# Patient Record
Sex: Male | Born: 1985 | Race: White | Hispanic: No | Marital: Single | Smoking: Never smoker
Health system: Southern US, Community
[De-identification: ages and names within clinical notes are randomized; demographics above are authoritative.]

## PROBLEM LIST (undated history)

## (undated) DIAGNOSIS — N289 Disorder of kidney and ureter, unspecified: Secondary | ICD-10-CM

## (undated) DIAGNOSIS — G40909 Epilepsy, unspecified, not intractable, without status epilepticus: Secondary | ICD-10-CM

## (undated) DIAGNOSIS — D649 Anemia, unspecified: Secondary | ICD-10-CM

## (undated) DIAGNOSIS — N2581 Secondary hyperparathyroidism of renal origin: Secondary | ICD-10-CM

## (undated) DIAGNOSIS — IMO0001 Reserved for inherently not codable concepts without codable children: Secondary | ICD-10-CM

## (undated) DIAGNOSIS — H9193 Unspecified hearing loss, bilateral: Secondary | ICD-10-CM

## (undated) DIAGNOSIS — K219 Gastro-esophageal reflux disease without esophagitis: Secondary | ICD-10-CM

## (undated) DIAGNOSIS — F79 Unspecified intellectual disabilities: Secondary | ICD-10-CM

## (undated) DIAGNOSIS — R399 Unspecified symptoms and signs involving the genitourinary system: Secondary | ICD-10-CM

## (undated) DIAGNOSIS — G809 Cerebral palsy, unspecified: Secondary | ICD-10-CM

## (undated) DIAGNOSIS — I10 Essential (primary) hypertension: Secondary | ICD-10-CM

## (undated) DIAGNOSIS — Z905 Acquired absence of kidney: Secondary | ICD-10-CM

## (undated) DIAGNOSIS — N319 Neuromuscular dysfunction of bladder, unspecified: Secondary | ICD-10-CM

## (undated) DIAGNOSIS — Z789 Other specified health status: Secondary | ICD-10-CM

## (undated) DIAGNOSIS — N186 End stage renal disease: Secondary | ICD-10-CM

## (undated) HISTORY — DX: End stage renal disease: N18.6

## (undated) HISTORY — PX: VESICOSTOMY: SHX2661

## (undated) HISTORY — DX: Gastro-esophageal reflux disease without esophagitis: K21.9

## (undated) HISTORY — DX: Unspecified intellectual disabilities: F79

## (undated) HISTORY — PX: BLADDER CLOSURE VESICOSTOMY: SHX1234

## (undated) HISTORY — PX: MEATOTOMY: SHX5133

## (undated) HISTORY — PX: TONSILLECTOMY: SUR1361

## (undated) HISTORY — DX: Acquired absence of kidney: Z90.5

## (undated) HISTORY — PX: TONSILECTOMY, ADENOIDECTOMY, BILATERAL MYRINGOTOMY AND TUBES: SHX2538

## (undated) HISTORY — DX: Secondary hyperparathyroidism of renal origin: N25.81

## (undated) HISTORY — DX: Unspecified hearing loss, bilateral: H91.93

## (undated) HISTORY — DX: Neuromuscular dysfunction of bladder, unspecified: N31.9

## (undated) HISTORY — DX: Epilepsy, unspecified, not intractable, without status epilepticus: G40.909

## (undated) HISTORY — PX: INGUINAL HERNIA REPAIR: SUR1180

## (undated) HISTORY — DX: Unspecified symptoms and signs involving the genitourinary system: R39.9

## (undated) HISTORY — DX: Anemia, unspecified: D64.9

---
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
## 2006-06-09 ENCOUNTER — Ambulatory Visit (HOSPITAL_COMMUNITY): Admission: RE | Admit: 2006-06-09 | Discharge: 2006-06-09 | Payer: Self-pay | Admitting: *Deleted

## 2009-02-22 ENCOUNTER — Encounter: Payer: Self-pay | Admitting: Internal Medicine

## 2009-03-29 ENCOUNTER — Encounter: Admission: RE | Admit: 2009-03-29 | Discharge: 2009-03-29 | Payer: Self-pay | Admitting: Orthopedic Surgery

## 2009-08-25 ENCOUNTER — Encounter (INDEPENDENT_AMBULATORY_CARE_PROVIDER_SITE_OTHER): Payer: Self-pay | Admitting: *Deleted

## 2009-09-15 ENCOUNTER — Emergency Department (HOSPITAL_COMMUNITY): Admission: EM | Admit: 2009-09-15 | Discharge: 2009-09-15 | Payer: Self-pay | Admitting: Emergency Medicine

## 2009-09-27 ENCOUNTER — Ambulatory Visit: Payer: Self-pay | Admitting: Internal Medicine

## 2009-09-27 ENCOUNTER — Encounter (INDEPENDENT_AMBULATORY_CARE_PROVIDER_SITE_OTHER): Payer: Self-pay | Admitting: *Deleted

## 2009-09-27 DIAGNOSIS — K219 Gastro-esophageal reflux disease without esophagitis: Secondary | ICD-10-CM | POA: Insufficient documentation

## 2009-09-27 DIAGNOSIS — R1319 Other dysphagia: Secondary | ICD-10-CM

## 2009-09-29 ENCOUNTER — Telehealth: Payer: Self-pay | Admitting: Internal Medicine

## 2009-09-29 ENCOUNTER — Ambulatory Visit (HOSPITAL_COMMUNITY): Admission: RE | Admit: 2009-09-29 | Discharge: 2009-09-29 | Payer: Self-pay | Admitting: Internal Medicine

## 2009-09-29 ENCOUNTER — Ambulatory Visit: Payer: Self-pay | Admitting: Internal Medicine

## 2009-10-04 ENCOUNTER — Telehealth: Payer: Self-pay | Admitting: Internal Medicine

## 2009-10-08 ENCOUNTER — Ambulatory Visit (HOSPITAL_COMMUNITY): Admission: RE | Admit: 2009-10-08 | Discharge: 2009-10-08 | Payer: Self-pay | Admitting: Internal Medicine

## 2010-08-11 NOTE — Progress Notes (Signed)
Summary: needs Ba awallow scheduled re: dysphagia  Phone Note Outgoing Call   Summary of Call: 3/23 biopsies do not show eosinophilic esophagitis - I told mom she related that he was having some eggsl and dairy and was told not to do so after EE dxed at [facility] (food allergies)  1) avoid eggs and dairy 2) we will schedule Denyse Amass for barium swallow with tablet (he has CP and is in a wheelchaior FYI) 3) avoid foods that trigger dyspgaia (has been discussed) and cut food into small pieces 4) I will call with results and plans of Ba swallow Iva Boop MD, [facility]  October 04, 2009 1:24 PM   Follow-up for Phone Call        Scheduled for Barium Swallow with tablet for this Friday 10/08/09 at 10 am. Is to be there at 9:45am. NPO after midnight .Mother made aware Follow-up by: Teryl Lucy RN,  October 05, 2009 8:58 AM

## 2010-08-11 NOTE — Procedures (Signed)
Summary: Ericka Pontiff MD   Ericka Pontiff MD   Imported By: Lester [place] 10/05/2009 09:48:12  _____________________________________________________________________  External Attachment:    Type:   Image     Comment:   External Document

## 2010-08-11 NOTE — Assessment & Plan Note (Signed)
Summary: ACID REFLUX/CHOKING...AS.   History of Present Illness Visit Type: Initial Visit Primary GI MD: Stan Head MD [facility] Primary Provider: Merri Brunette, MD Chief Complaint: acid reflux and choking History of Present Illness:   25 year old white man with cerebral palsy, here do to dysphagia and acid reflux problems. He is here with his mom, he give some history but mom his most. He has been having intermittent but now daily solid food dysphagia with a suprasternal sticking point over the past few months. He has been able to repeat swallow and drink water to force the food down. Meats and breads are particular culprits. He had a similar problem a number of years ago and was evaluated and treated at [facility], with an EGD and was told he had erosions in the esophagus. He was placed on a PPI and had done well until lately it sounds like. His mom thinks he had a barium swallow at that time the did not show much.  Subsequent to that visit I have obtained pathology report from 11 one 2002, both distal and mid esophageal biopsies show numerous intraepithelial eosinophils consistent with reflux or allergic esophagitis. He had duodenal and gastric biopsies that were normal at that time. They were also mass cells in the esophageal submucosa. 6 per high-powered field. On the eosinophils liver over 50 per high power field.   GI Review of Systems    Reports acid reflux and  dysphagia with solids.      Denies abdominal pain, belching, bloating, chest pain, dysphagia with liquids, heartburn, loss of appetite, nausea, vomiting, vomiting blood, weight loss, and  weight gain.        Denies anal fissure, black tarry stools, change in bowel habit, constipation, diarrhea, diverticulosis, fecal incontinence, heme positive stool, hemorrhoids, irritable bowel syndrome, jaundice, light color stool, liver problems, rectal bleeding, and  rectal pain.    EGD  Procedure date:  05/09/2001  Findings:  diffuse esophagitis, vertical redlines. Distal and MID biopsy showing eosinophilic esophagitis with greater than 50 eosinophils per high power field. Normal stomach and duodenum, normal biopsies Dr. Ulla Potash, [facility]   Current Medications (verified): 1)  Prednisone 5 Mg Tabs (Prednisone) .Marland Kitchen.. 1 Tablet By Mouth Once Daily 2)  Prograf 1 Mg Caps (Tacrolimus) .... 3 Tablets By Mouth Two Times A Day 3)  Ergocalciferol 50000 Unit Caps (Ergocalciferol) .... Once Weekly 4)  Prilosec 20 Mg Cpdr (Omeprazole) .Marland Kitchen.. 1 Capsule By Mouth Once Daily 5)  Amlodipine Besylate 10 Mg Tabs (Amlodipine Besylate) .Marland Kitchen.. 1 Tablet By Mouth Once Daily 6)  Clonidine Hcl 0.1 Mg Tabs (Clonidine Hcl) .Marland Kitchen.. 1 Tablet By Mouth Once Daily 7)  Nitrofurantoin Macrocrystal 100 Mg Caps (Nitrofurantoin Macrocrystal) .Marland Kitchen.. 1 Tablet  By Mouth Once Daily 8)  Azathioprine 50 Mg Tabs (Azathioprine) .Marland Kitchen.. 1 Tablet By Mouth Once Daily 9)  Oxytrol 3.9 Mg/24hr Pttw (Oxybutynin) .Marland Kitchen.. 1 Patch Twice Weekly  Allergies (verified): 1)  ! 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) Severe Hearing Loss w/Hearing Aids GERD Pneumonia Panic Attacks Neurogenic Bladder Hypertension Kidney Stones Small Bowel Obstruction Anemia eosinophilic esophagitis, 2002 EGD  Past Surgical History: Multiple Bladder Reconstruction Multiple Tendon Release for CP Eye Surgery for Strabismus Bilateral inguinal hernia Vesicostomy placed closure of vesicostomy meatotomy CAPD Catheter placement Omentectomy  Inguinal and incisional hernia repair Renal Transplant Vesicostomy replaced Leg Surgery bilateral tonsillectomy and Adenoidectomy Urostomy Bowel Blockage Surgery Kidney  biopsy with hemorrhage and blood clots Leg Muscle Surgery Nephrectomy Lt.  Family History: Reviewed history and no changes required. Thyrooid Cancer-Dad  Grandmother Celiac Sprue Aunt Family History of Colon Cancer:Great Aunt Father's Sister  Social History: Reviewed history and no changes required. Disabled Single Patient has never smoked.  Alcohol Use - no Illicit Drug Use - no  Review of Systems       The patient complains of fatigue, hearing problems, and urine leakage.         All other ROS negative except as per HPI.   Vital Signs:  Patient profile:   25 year old male Height:      64 inches Weight:      162 pounds BMI:     27.91 Pulse rate:   124 / minute Pulse rhythm:   regular BP sitting:   142 / 80  (left arm)  Vitals Entered By: Milford Cage NCMA (September 27, 2009 9:24 AM)  Physical Exam  General:  short statured.   Head:  mild deformity Eyes:  anicteric strabismus Ears:  hearing aids Mouth:  clear Neck:  Supple; no masses or thyromegaly. Lungs:  Clear throughout to auscultation. Heart:  Regular rate and rhythm; no murmurs, rubs,  or bruits. Abdomen:  scars transplanted kidney soft and nontender BS+ Extremities:  no edema Neurologic:  flaccid LE's awake alert follows commands  Cervical Nodes:  No significant cervical or supraclavicular adenopathy.  Psych:  appears appropriate given neurologic issues   Impression & Recommendations:  Problem # 1:  DYSPHAGIA (ZOX-096.04) Assessment New there are multiple possibilities but eosinophilic esophagitis seems the most likely now that I have the records after the visit. Esophageal stricture and dysmotility and reflux could be playing a role also. It looks like he was treated with Flovent in the past. I explained the possibility of eosinophilic esophagitis but mom did not seem to remember this.date Now but I know he had eosinophilic esophagitis, and dilation is much less likely. If I do so, unless there is a discrete stricture it would be a bougie dilation.   Risks, benefits,and indications of endoscopic procedure(s) were reviewed with the patient and all  questions answered. icreased comorbidities raise the level of risk of this procedure.  Orders: ZEGD Balloon Dil (ZEGD Balloon)  Problem # 2:  GERD (ICD-530.81) Assessment: New ? Change PPI Some of the symptoms could be due to eosinophilic esophagitis though GERD frequently goes along with that. Await EGD to make medication changes.  Patient Instructions: 1)  Avoid meats and breads and stay on soft foods until the endoscopy and dilation March 23. 2)  Please continue current medications.  3)  Copy sent to : Merri Brunette, MD; Zetta Bills, MD 4)  Upper Endoscopy with Dilatation brochure given.  5)  The medication list was reviewed and reconciled.  All changed / newly prescribed medications were explained.  A complete medication list was provided to the patient / caregiver.

## 2010-08-11 NOTE — Procedures (Signed)
Summary: EGD Instructions/Signed  Instruction for procedure/[facility] (out pt)   Imported By: Sherian Rein 09/30/2009 08:26:20  _____________________________________________________________________  External Attachment:    Type:   Image     Comment:   External Document

## 2010-08-11 NOTE — Procedures (Signed)
Summary: Ericka Pontiff MD  Ericka Pontiff MD   Imported By: Lester [place] 10/05/2009 09:45:42  _____________________________________________________________________  External Attachment:    Type:   Image     Comment:   External Document

## 2010-08-11 NOTE — Letter (Signed)
Summary: [facility]rology/[facility]  [facility]phrology/[facility]   Imported By: Lester [place] 10/05/2009 09:44:20  _____________________________________________________________________  External Attachment:    Type:   Image     Comment:   External Document

## 2010-08-11 NOTE — Letter (Signed)
Summary: EGD Instructions   Gastroenterology  [address] [place], [place] [address]   Phone: [phone]  Fax: [phone]       Caleb Hinton    [birth year]    MRN: [id number]       Procedure Day Dorna Bloom: Wednesday, 09/29/09     Arrival Time: 10:00 am     Procedure Time: 11:30 am     Location of Procedure:                    _X_ [facility] (Outpatient Registration)   PREPARATION FOR ENDOSCOPY   On Wednesday, 09/29/09 THE DAY OF THE PROCEDURE:  1.   No solid foods, milk or milk products are allowed after midnight the night before your procedure.  2.   Do not drink anything colored red or purple.  Avoid juices with pulp.  No orange juice.  3.  You may drink clear liquids until 7:30 am, which is 4 hours before your procedure.                                                                                                CLEAR LIQUIDS INCLUDE: Water Jello Ice Popsicles Tea (sugar ok, no milk/cream) Powdered fruit flavored drinks Coffee (sugar ok, no milk/cream) Gatorade Juice: apple, white grape, white cranberry  Lemonade Clear bullion, consomm, broth Carbonated beverages (any kind) Strained chicken noodle soup Hard Candy   MEDICATION INSTRUCTIONS  Unless otherwise instructed, you should take regular prescription medications with a small sip of water as early as possible the morning of your procedure.                  OTHER INSTRUCTIONS  You will need a responsible adult at least 24 years of age to accompany you and drive you home.   This person must remain in the waiting room during your procedure.  Wear loose fitting clothing that is easily removed.  Leave jewelry and other valuables at home.  However, you may wish to bring a book to read or an iPod/MP3 player to listen to music as you wait for your procedure to start.  Remove all body piercing jewelry and leave at home.  Total time from sign-in until discharge is approximately 2-3  hours.  You should go home directly after your procedure and rest.  You can resume normal activities the day after your procedure.  The day of your procedure you should not:   Drive   Make legal decisions   Operate machinery   Drink alcohol   Return to work  You will receive specific instructions about eating, activities and medications before you leave.    The above instructions have been reviewed and explained to me by   _______________________    I fully understand and can verbalize these instructions _____________________________ Date _________

## 2010-08-11 NOTE — Letter (Signed)
Summary: New Patient letter  [facility] Gastroenterology  [address] [place], [place] [address]   Phone: [phone]  Fax: [phone]       08/25/2009 MRN: [id number]  [facility] [place] [address] [place], [place]  [address]  Dear Caleb Hinton,  Welcome to the Gastroenterology Division at [facility].    You are scheduled to see Dr. Leone Payor on 09/27/2009 at 8:45AM on the 3rd floor at [facility], [address]  We ask that you try to arrive at our office 15 minutes prior to your appointment time to allow for check-in.  We would like you to complete the enclosed self-administered evaluation form prior to your visit and bring it with you on the day of your appointment.  We will review it with you.  Also, please bring a complete list of all your medications or, if you prefer, bring the medication bottles and we will list them.  Please bring your insurance card so that we may make a copy of it.  If your insurance requires a referral to see a specialist, please bring your referral form from your primary care physician.  Co-payments are due at the time of your visit and may be paid by cash, check or credit card.     Your office visit will consist of a consult with your physician (includes a physical exam), any laboratory testing he/she may order, scheduling of any necessary diagnostic testing (e.g. x-ray, ultrasound, CT-scan), and scheduling of a procedure (e.g. Endoscopy, Colonoscopy) if required.  Please allow enough time on your schedule to allow for any/all of these possibilities.    If you cannot keep your appointment, please call [phone] to cancel or reschedule prior to your appointment date.  This allows [place] the opportunity to schedule an appointment for another patient in need of care.  If you do not cancel or reschedule by 5 p.m. the business day prior to your appointment date, you will be charged a $50.00 late cancellation/no-show fee.    Thank you for choosing  [name] for your medical needs.  We appreciate the opportunity to care for you.  Please visit [place] at our website  to learn more about our practice.                     Sincerely,                                                             The Gastroenterology Division

## 2010-08-11 NOTE — Procedures (Signed)
Summary: Upper Endoscopy  Patient: Caleb Hinton Note: All result statuses are Final unless otherwise noted.  Tests: (1) Upper Endoscopy (EGD)   EGD Upper Endoscopy       DONE     [facility]     [address] [place], [place]  [address]           ENDOSCOPY PROCEDURE REPORT           PATIENT:  [name], [name]  MR#:  [id number]     BIRTHDATE:  Oct 06, 1985, 23 yrs. old  GENDER:  male           ENDOSCOPIST:  Iva Boop, MD, [facility]           PROCEDURE DATE:  09/29/2009     PROCEDURE:  EGD with biopsy     ASA CLASS:  Class III     INDICATIONS:  dysphagia           MEDICATIONS:   Fentanyl 50 mcg, Versed 5.5 mg     TOPICAL ANESTHETIC:  Cetacaine Spray           DESCRIPTION OF PROCEDURE:   After the risks benefits and     alternatives of the procedure were thoroughly explained, informed     consent was obtained.  The  endoscope was introduced through the     mouth and advanced to the second portion of the duodenum, without     limitations.  The instrument was slowly withdrawn as the mucosa     was fully examined.     <<PROCEDUREIMAGES>>           Abnormal appearing mucosa in the esophagus.  Linear and     longitudinal fissures and subtle rings seen throughout the     esophagus. Multiple biopsies were obtained and sent to pathology.     Otherwise the examination was normal.    Retroflexed views revealed     no abnormalities.    The scope was then withdrawn from the patient     and the procedure completed.           COMPLICATIONS:  None           ENDOSCOPIC IMPRESSION:     1) Abnormal mucosa in the esophagus that is suggestive of     eosinophilic esophagits which has been diagnosed before (2002)     2) Otherwise normal examination           RECOMMENDATIONS:     will need to increase PPI and await biopsies.           REPEAT EXAM:  as needed           Iva Boop, MD, [facility]           CC:  Merri Brunette, MD     Zetta Bills, MD     The Patient           n.   eSIGNED:   Iva Boop at 09/29/2009 12:19 PM           Delphina Cahill, [id number]  Note: An exclamation mark (!) indicates a result that was not dispersed into the flowsheet. Document Creation Date: 09/29/2009 12:20 PM _______________________________________________________________________  (1) Order result status: Final Collection or observation date-time: 09/29/2009 11:58 Requested date-time:  Receipt date-time:  Reported date-time:  Referring Physician:   Ordering Physician: Stan Head [phone]) Specimen Source:  Source: Launa Grill Order Number: [id number] Lab site:

## 2010-08-11 NOTE — Progress Notes (Signed)
Summary: Need records refaxed  Phone Note Other Incoming   Initial call taken by: Karna Christmas,  September 29, 2009 11:12 AM Caller: [facility] Summary of Call: Received fax this morning but is missing page 2 of the fax. Appt. this morning at 11:30 Fax# [phone] Initial call taken by: Karna Christmas,  September 29, 2009 11:15 AM  Follow-up for Phone Call        records sent again Follow-up by: Darcey Nora RN, CGRN,  September 29, 2009 11:16 AM

## 2010-10-05 ENCOUNTER — Ambulatory Visit: Payer: BC Managed Care – PPO | Admitting: Physical Therapy

## 2010-11-25 NOTE — Consult Note (Signed)
NAME:  Caleb Hinton, Caleb Hinton                           ACCOUNT NO.:  [id number]   MEDICAL RECORD NO.:  [id number]                   PATIENT TYPE:  INP   LOCATION:  6148                                 FACILITY:  [facility]   PHYSICIAN:  Pramod P. Pearlean Brownie, MD                 DATE OF BIRTH:  [birth year]   DATE OF CONSULTATION:  03/24/2004  DATE OF DISCHARGE:                                   CONSULTATION   REASON FOR REFERRAL:  Seizure.   HISTORY OF PRESENT ILLNESS:  Caleb Hinton is a 25 year old male who was  brought for evaluation for a brief episode of altered behavior.  The history  is provided by the patient's mother.  She states the patient had just  finished Bible school, and he told her that he was not feeling well.  He had  a strange feeling in his stomach, and had some trouble breathing, and he was  asked to sit down.  The mother noticed that he was shaking in all 4  extremities, as well as his lips.  He was, however, fully awake, remained  conversant, and stated that he was feeling bad.  He did not lose complete  consciousness.  He was also noticed as having palpitations, and then he had  a dry cough, but did not vomit.  He complained of dizziness.  This episode  lasted about 15 minutes, and after that he felt tired and slept.  There was  no tonic-clonic activity, tongue bite, or any injury.  The patient had  similar episodes in the last 6 months.  The mother can recall at least 3  episodes.  The episodes last variably from 15 minutes to an hour.  During  the second episode, in addition to the above symptoms, the patient also had  some trouble speaking, and could not express himself for about 30 minutes.  There were no other focal findings described in these episodes.  On inquiry,  the mother states she has noticed several episodes in the last 6-8 months  when he has been staring unresponsively for a minute or two, and is not  responsive to questions.  These occur once a week, and do not  last more than  a minute.  She has not been evaluated for these episodes recently.  Has no  documented history of seizures, strokes, other significant neurological  problems.   PAST MEDICAL HISTORY:  1.  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      He needs assistance with his activities of daily living.  He is      cognitively functioning at the level of first grade.  He can feed      himself, but needs assistance with most activities of daily living.  He  used to go to special education school, but for the last 5 years has      been home schooled.  2.  Renal failure since birth.  He had a renal transplant at age 38-1/2.  He      has had multiple renal rejection episodes which have been treated.  3.  He has had multiple surgeries for his bladder reconstruction.  4.  Multiple surgeries for tendon release for his cerebral palsy.  5.  Eye surgeries for strabismus.  6.  He has severe hearing loss.  7.  He has had multiple bouts of pneumonia in the past.  8.  He also has a history of gastroesophageal reflux disease.   HOME MEDICATIONS:  1.  Prograf.  2.  Prednisone.  3.  Imuran.  4.  Prilosec.  5.  Back pain.  6.  Macrodantin.  7.  Minoxidil.  8.  Detrol.  9.  Ditropan.  10. Multivitamin.  11. Flovent.   SOCIAL HISTORY:  The patient lives with his parents at home.  He has  outpatient physical and speech therapy once a week.  Mom is doing home day  care.   FAMILY HISTORY:  Not significant for anybody with seizures or neurological  problems.   REVIEW OF SYSTEMS:  No diarrhea.  Significant for multiple bouts of  pneumonia.  Bladder infection.   PHYSICAL EXAMINATION:  GENERAL:  A pleasant adolescent male not in distress.  VITAL SIGNS:  Afebrile.  Pulse rate is 110 per minute, regular.  Blood  pressure is 152/95, respiratory rate 20 per minute.  Distal pulses well   felt.  HEENT:  Non-traumatic.  The patient has malocclusive teeth which have poor  hygiene.  NECK:  Supple without bruit.  CARDIAC:  Tachycardia, but no murmur.  LUNGS:  Clear to auscultation.  ABDOMEN:  Soft, nontender.  NEUROLOGIC:  The patient is awake, alert, oriented to time, place, and  person.  His speech is slow and dysarthric.  There is no aphasia.  He  follows commands appropriately.  Pupils equal, round and reactive to light  and accommodation.  He has good visual Bonet and acuity.  He has bilateral  exotropia and mild gaze nystagmus, looking horizontally.  Face is symmetric.  Palate elevates normally.  Tongue is midline.  MOTOR SYSTEM:  Increased tone, lower extremity more than upper extremities.  He has fairly symmetric and good strength in the upper extremities.  He has  mild paraparesis bilaterally with 3-4/5 strength.  He has significant  weakness of hip flexors and ankle dorsiflexors bilaterally.  Plantars are  upgoing.  Tone is increased in both legs.  Sensation is vastly preserved.  He has bilateral finger-to-nose ataxia, right more than left, and  __________.  Gait was not tested.   DATA REVIEWED:  None.   IMPRESSION:  A 25 year old male with mental retardation, cerebral palsy,  with recurrent __________ episodes of generalized extremity shaking with  preserved consciousness of unclear etiology.  These unlikely represent  seizures, and other differential should include gastroesophageal reflux  disease versus anxiety panic episodes or migraine variant.  He has had other  unrelated episodes of brief staring, which may perhaps represent complex  partial seizures.   PLAN:  I would recommend obtained an EEG and an MRI scan of the brain.  Hold  off on anticonvulsants at the present time.  I had a long discussion with  the patient's mother about my impression, plan, and answered questions.  The patient will be  followed in the a.m. by Dr. Sharene Skeans, pediatric  neurologist.   Thank you for the referral.                                               Pramod P. Pearlean Brownie, MD    PPS/MEDQ  D:  03/24/2004  T:  03/24/2004  Job:  [id number]

## 2010-11-25 NOTE — Procedures (Signed)
REFERRING PHYSICIAN:  Henrietta Hoover, M.D.   CLINICAL HISTORY:  A 25 year old male with mental retardation and cerebral  palsy with episodes of brief awareness as well as generalized extremity  shaking being evaluated for possible seizure activity.   This is a 17-channel routine EEG recorded using standard 10/20 electrode  placement.   Background rhythm consists of 11 to 12 hertz alpha which is rhythmic,  symmetric, and reactive to eye opening and closure. No paroxysmal  epileptiform activity spikes or sharp waves are identified. There are no  focal abnormalities noted. Sleep changes are not achieved naturally in this  tracing. The patient is noted as being drowsy and has prominent central  temporal sharp waves during drowsiness.   Length of the tracing is 25.8 minutes. Technical component is average. EKG  tracing reveals regular sinus rhythm. Hyperventilation and photic  stimulation resulted in no significant abnormalities.   IMPRESSION:  This EEG performed in the wakeful states and drowsiness is  within normal limits. No definite epileptiform activity is identified. If  seizures are strongly suspected, a repeat/sleep deprived study may be  beneficial.    PRAMOD Lina Sayre, MD   QMV:HQIO  D:  03/24/2004 17:36:47  T:  03/25/2004 12:17:18  Job #:  [id number]

## 2010-11-25 NOTE — Discharge Summary (Signed)
Caleb Hinton, Caleb Hinton                 ACCOUNT NO.:  [id number]   MEDICAL RECORD NO.:  [id number]          PATIENT TYPE:  INP   LOCATION:  6151                         FACILITY:  [facility]   PHYSICIAN:  Orie Rout, M.D.DATE OF BIRTH:  [birth year]   DATE OF ADMISSION:  08/03/2004  DATE OF DISCHARGE:  08/04/2004                                 DISCHARGE SUMMARY   REASON FOR HOSPITALIZATION:  A 25 year old male with past medical history  significant for CP and a kidney transplant admitted for hyperkalemia,  decreased p.o. intake, and dehydration.  Significant findings:  1.  Increased potassium on admission of 6.5.  Decreased bicarb on admission      of 17.6, and increased creatinine on admission from 1.9.  His last      creatinine at [facility] was 1.6-1.7.  2.  Hypertensive on admission with blood pressures ranging from 149-159 over      81-86.  3.  EKG on admission showing no peak T-waves, but sinus tachycardia with      sinus arrhythmia and short PR interval.  4.  Chest x-ray showing no active disease.  5.  Myoglobin of 108, CK-MB of less than 1, troponin of less than 0.05.  6.  On August 04, 2004 potassium was decreased down to 4.5, bicarb had      increased to 23, and creatinine had come back down to 1.5.   TREATMENT:  Guthrie was admitted and placed on CR monitor, rehydrated with IV  fluids, given Kayexalate x1 for his hyperkalemia, Bicitra for his low  bicarb, and increased this morning, dose of Minoxidil, and added Lasix for  his hypertension.  Nutrition consulted on foods that were high in potassium  for mom to avoid.  At discharge the patient's electrolytes had normalized  and he was tolerating p.o.  He has had no adverse events on the CR monitor.   FINAL DIAGNOSES:  1.  Hyperkalemia.  2.  Kidney transplant, history of posterior urethral valve.  3.  MRCP with spastic __________.  4.  Hypertension.  5.  Gastroesophageal reflux disease.  6.  Hearing loss with hearing aids.  7.   Panic attacks.   DISCHARGE MEDICATIONS/INSTRUCTIONS:  1.  Minoxidil 7.5 mg q.a.m. and 5 mg q.p.m.  2.  Lasix 10 mg p.o. daily.  3.  Bicitra 15 ml p.o. b.i.d.  4.  Prograf 4 mg p.o. b.i.d.  5.  __________50 mg p.o. daily.  6.  Prednisone 5 mg p.o. daily.  7.  Macrobid 100 mg p.o. q.p.m.  8.  Detrol LA 4 mg p.o. q.a.m.  9.  Ditropan 5 mg p.o. q.p.m.  10. A steroid inhaler.  11. Labetalol p.r.n.   FOLLOW UP:  Follow-up will be with Dr. Clarene Duke, Clide's primary care  physician, on August 09, 2004, at 10:45 in the morning, and with Dr.  Janey Greaser who has been following him at [facility] to be scheduled by mom  in the next couple of weeks.   CONDITION ON DISCHARGE:  Discharge weight was 56.8 kg and discharge  condition was good.  PR/MEDQ  D:  08/04/2004  T:  08/04/2004  Job:  [id number]   cc:   Fonnie Mu, M.D.  Fax: [phone]   Dr. Janey Greaser

## 2010-12-10 IMAGING — RF DG ESOPHAGUS
4 of 5 series · 15 of 24 positions shown · non-contrast
Comparison: None

CLINICAL DATA: Dysphagia.  Cerebral palsy.

ESOPHOGRAM/BARIUM SWALLOW
TECHNIQUE: Single contrast examination was performed using thin
barium.
Fluoroscopy time:  1.7 minutes.

[Series 1: run · 9 of 21 slices shown (1 of 4)]
[im 1/21]
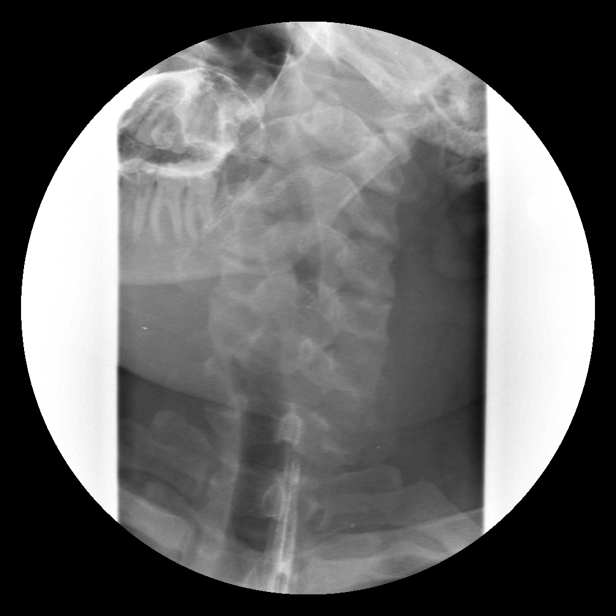
[im 4/21]
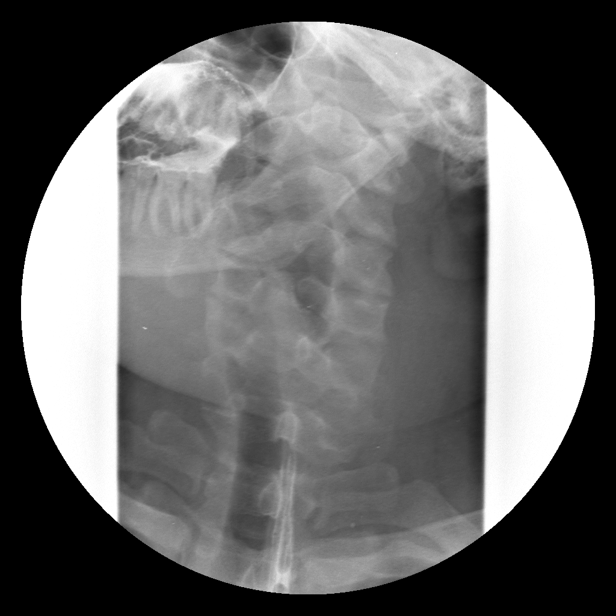
[im 7/21]
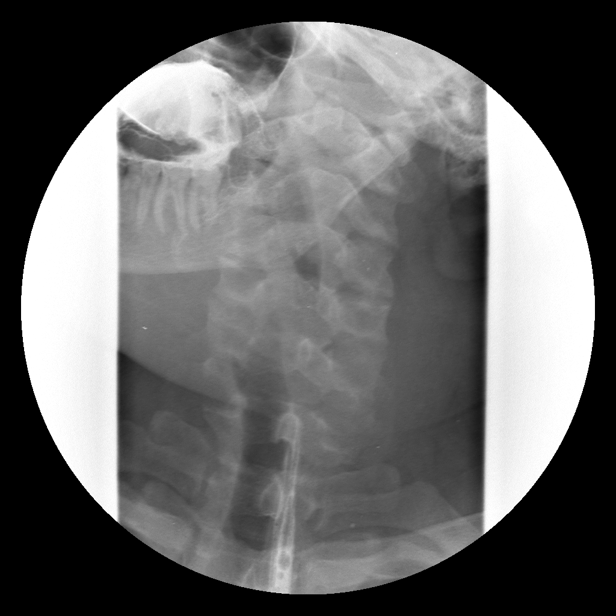
[im 8/21]
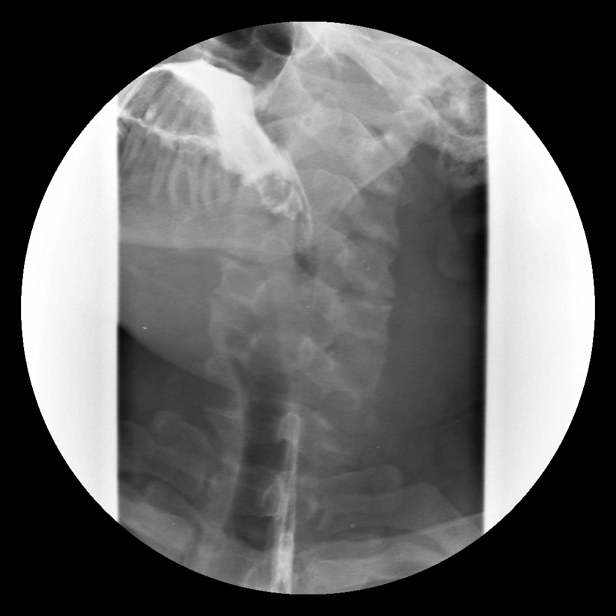
[im 11/21]
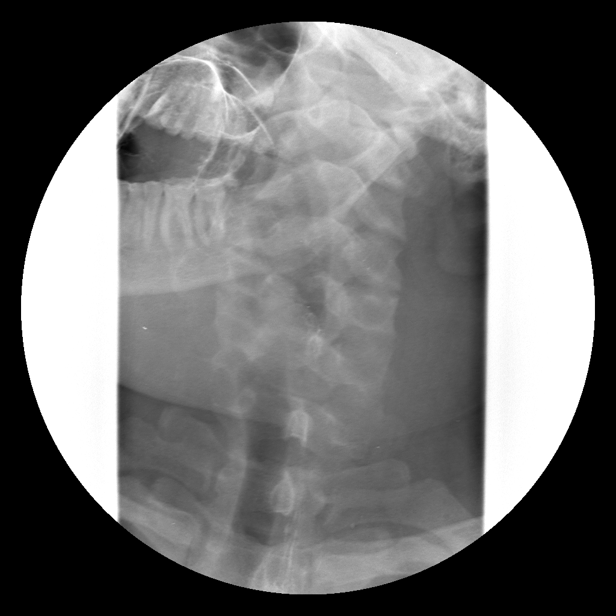
[im 13/21]
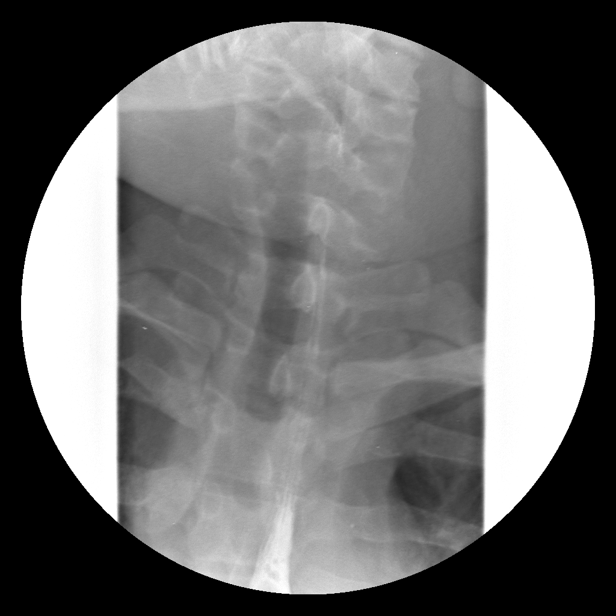
[im 16/21]
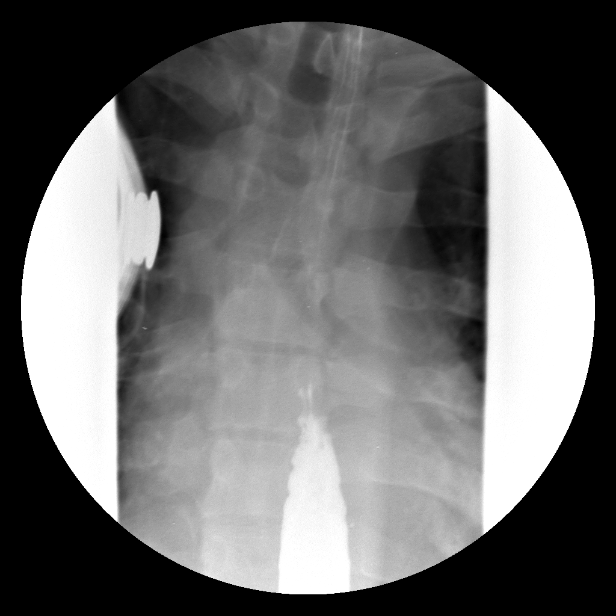
[im 19/21]
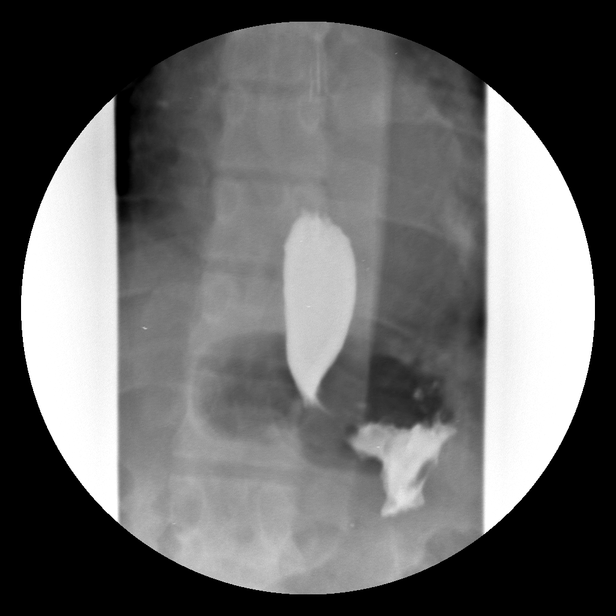
[im 21/21]
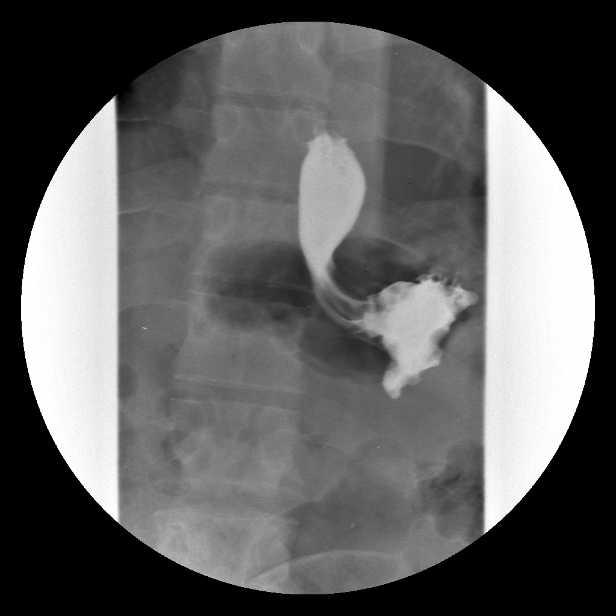

[Series 2: run · 4 of 9 slices shown (2 of 4)]
[im 2/9]
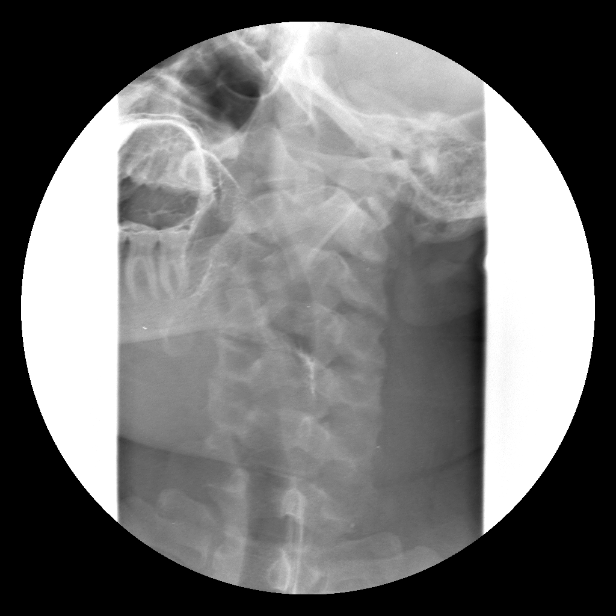
[im 3/9]
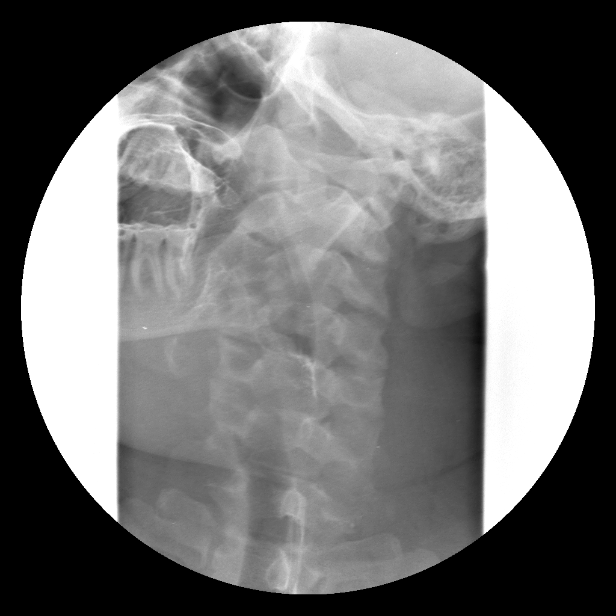
[im 6/9]
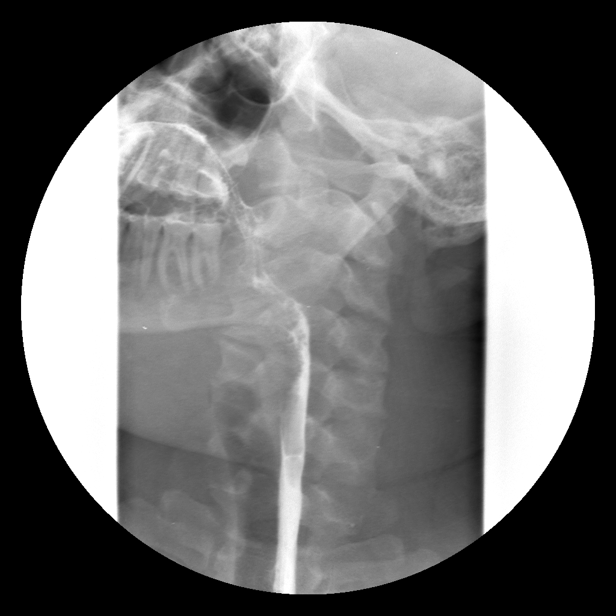
[im 9/9]
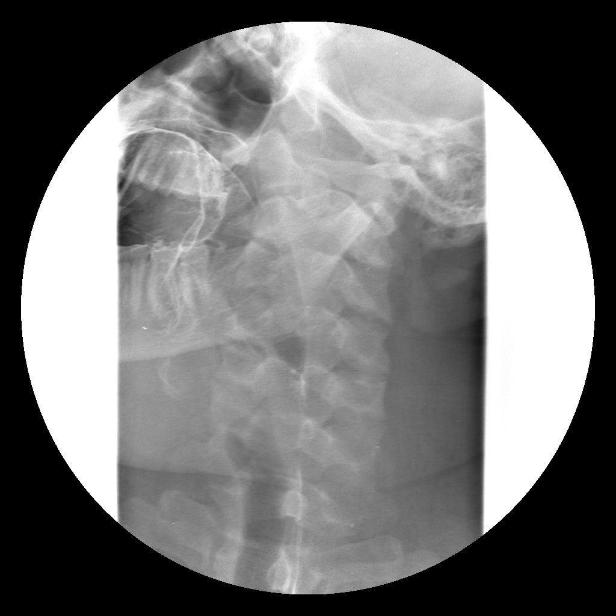

[Series 3: run · 1 of 1 slices shown (3 of 4)]
[im 1/1]
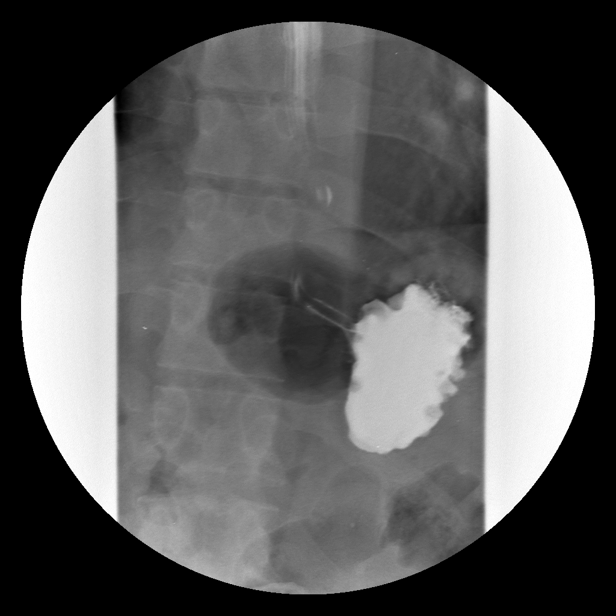

[Series 5: run · 1 of 1 slices shown (4 of 4)]
[im 1/1]
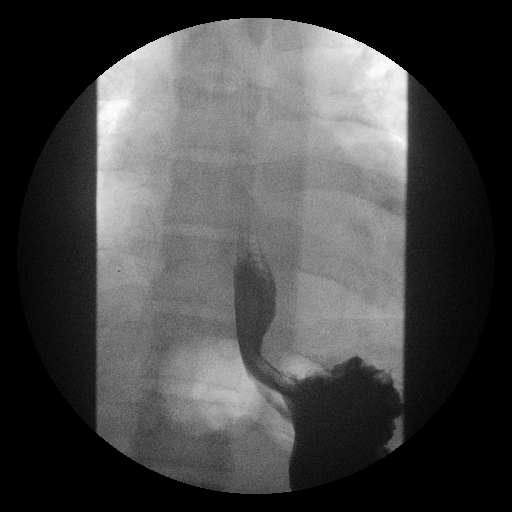

[15 of 24 positions shown; findings below may reference images not displayed]

FINDINGS: The study was limited as the patient was unable to drink
a large amount of barium.  The esophageal mucosa and motility are
normal.  The esophagus is not well distended due to small boluses.
Pharyngeal phase of swallowing appears satisfactory.  No stricture
or mass was identified.  There is no hiatal hernia.

The patient swallowed barium tablet which passed readily into the
stomach.  The patient had some difficulty initially with swallowing
which is typical however there  was  no delay in the esophagus.
IMPRESSION: Negative for hiatal hernia.  There is no stricture.  Barium tablet
passed readily into the stomach.

The study is limited by small boluses.

## 2013-07-11 ENCOUNTER — Other Ambulatory Visit: Payer: Self-pay | Admitting: Nephrology

## 2013-07-11 ENCOUNTER — Ambulatory Visit
Admission: RE | Admit: 2013-07-11 | Discharge: 2013-07-11 | Disposition: A | Discharge status: Home / Self Care | Payer: Managed Care, Other (non HMO) | Source: Ambulatory Visit | Attending: Nephrology | Admitting: Nephrology

## 2013-07-11 DIAGNOSIS — R52 Pain, unspecified: Secondary | ICD-10-CM

## 2013-09-16 ENCOUNTER — Other Ambulatory Visit: Payer: Self-pay | Admitting: Nephrology

## 2013-09-16 ENCOUNTER — Ambulatory Visit
Admission: RE | Admit: 2013-09-16 | Discharge: 2013-09-16 | Disposition: A | Discharge status: Home / Self Care | Payer: Managed Care, Other (non HMO) | Source: Ambulatory Visit | Attending: Nephrology | Admitting: Nephrology

## 2013-09-16 DIAGNOSIS — N179 Acute kidney failure, unspecified: Secondary | ICD-10-CM

## 2013-09-16 DIAGNOSIS — Z940 Kidney transplant status: Secondary | ICD-10-CM

## 2013-11-13 ENCOUNTER — Emergency Department (HOSPITAL_BASED_OUTPATIENT_CLINIC_OR_DEPARTMENT_OTHER): Payer: Managed Care, Other (non HMO)

## 2013-11-13 ENCOUNTER — Encounter (HOSPITAL_BASED_OUTPATIENT_CLINIC_OR_DEPARTMENT_OTHER): Payer: Self-pay | Admitting: Emergency Medicine

## 2013-11-13 ENCOUNTER — Emergency Department (HOSPITAL_BASED_OUTPATIENT_CLINIC_OR_DEPARTMENT_OTHER)
Admission: EM | Admit: 2013-11-13 | Discharge: 2013-11-13 | Disposition: A | Discharge status: Home / Self Care | Payer: Managed Care, Other (non HMO) | Attending: Emergency Medicine | Admitting: Emergency Medicine

## 2013-11-13 DIAGNOSIS — M79672 Pain in left foot: Secondary | ICD-10-CM

## 2013-11-13 DIAGNOSIS — Z87448 Personal history of other diseases of urinary system: Secondary | ICD-10-CM | POA: Insufficient documentation

## 2013-11-13 DIAGNOSIS — Z8669 Personal history of other diseases of the nervous system and sense organs: Secondary | ICD-10-CM | POA: Insufficient documentation

## 2013-11-13 DIAGNOSIS — I10 Essential (primary) hypertension: Secondary | ICD-10-CM | POA: Insufficient documentation

## 2013-11-13 DIAGNOSIS — Z8719 Personal history of other diseases of the digestive system: Secondary | ICD-10-CM | POA: Insufficient documentation

## 2013-11-13 DIAGNOSIS — Z880 Allergy status to penicillin: Secondary | ICD-10-CM | POA: Insufficient documentation

## 2013-11-13 DIAGNOSIS — H519 Unspecified disorder of binocular movement: Secondary | ICD-10-CM | POA: Insufficient documentation

## 2013-11-13 DIAGNOSIS — M79609 Pain in unspecified limb: Secondary | ICD-10-CM | POA: Insufficient documentation

## 2013-11-13 HISTORY — DX: Reserved for inherently not codable concepts without codable children: IMO0001

## 2013-11-13 HISTORY — DX: Gastro-esophageal reflux disease without esophagitis: K21.9

## 2013-11-13 HISTORY — DX: Cerebral palsy, unspecified: G80.9

## 2013-11-13 HISTORY — DX: Disorder of kidney and ureter, unspecified: N28.9

## 2013-11-13 HISTORY — DX: Essential (primary) hypertension: I10

## 2013-11-13 MED ORDER — HYDROCODONE-ACETAMINOPHEN 5-325 MG PO TABS: 1.0000 | ORAL_TABLET | Freq: Three times a day (TID) | ORAL | Status: DC | PRN

## 2013-11-13 MED ORDER — HYDROCODONE-ACETAMINOPHEN 5-325 MG PO TABS: 1.0000 | ORAL_TABLET | Freq: Four times a day (QID) | ORAL | Status: DC | PRN

## 2013-11-13 MED ORDER — HYDROCODONE-ACETAMINOPHEN 5-325 MG PO TABS
1.0000 | ORAL_TABLET | Freq: Once | ORAL | Status: AC
Start: 2013-11-13 — End: 2013-11-13
  Administered 2013-11-13: 1 via ORAL
  Filled 2013-11-13: qty 1

## 2013-11-13 NOTE — ED Provider Notes (Signed)
CSN: [id number]     Arrival date & time 11/13/13  1129 History   First MD Initiated Contact with Patient 11/13/13 1142     Chief Complaint  Patient presents with  . Foot Pain    HPI  Mom reports that patient has had 2 weeks of left foot pain that over the past few days has been spreading to include entire foot and ankle. Yesterday afternoon, he started mentioning it was severe. Mom has not noticed any swelling above normal for him, and he has had no known trauma. He is very unlikely to complain so she is taking this seriously. He also has been breathing more deeply recently than normal. He is relatively immobile but occasionally uses a walker. Recently, they took a 2 hour road trip. He is now unable to bear weight.  Mom also reports pt has right side pain but no skin changes. He had a transplanted kidney >20 years ago that he follows up with nephrology monthly for, and at most recent check they stated kidney continues on downward trend, likely due to age of transplant. Mom and pt deny other symptoms (chest pain, fever, chills, rash, nausea, vomiting, diarrhea, urinary changes). He has an catherizable stoma that does not appear infected.   Past Medical History  Diagnosis Date  . Renal disorder   . Cerebral palsy   . Reflux   . Hypertension    Past Surgical History  Procedure Laterality Date  . Vesicostomy    . Inguinal hernia repair    . Bladder closure vesicostomy    . Meatotomy    . Kidney transplant    . Tonsilectomy, adenoidectomy, bilateral myringotomy and tubes    Renal transplant as infant.  History reviewed. No pertinent family history. History  Substance Use Topics  . Smoking status: Never Smoker   . Smokeless tobacco: Not on file  . Alcohol Use: Not on file  Lives with mom and dad.  Review of Systems  Unable to perform ROS: Patient nonverbal  Remainder of ROS per mother above.  Allergies  Amoxicillin and Eggs or egg-derived products  Home Medications   Prior to  Admission medications   Not on File   BP 177/102  Pulse 132  Temp(Src) 98 F (36.7 C) (Axillary)  Resp 16  Wt 157 lb (71.215 kg)  SpO2 97% Physical Exam GEN: NAD, pleasant, lying in bed HEENT: Atraumatic, normocephalic, neck supple, strabismus, sclera clear, poor dentition CV: Tachycardic to 120-130s with regular rhythm, no murmurs, rubs, or gallops PULM: CTAB, normal effort ABD: Soft, nontender, mildly distended, NABS, no organomegaly, vertical scar at umbilicus that is well-healed, no firmness/induration/mass palpated in abdomen SKIN: No rash or cyanosis; warm and well-perfused EXTR: No lower extremity edema or calf tenderness; bilateral feet with poor movement at baseline, left anterior foot tenderness below toes and almost to malleoli, no erythema or swelling, able to plantar- and dorsiflex foot but with reproduced pain, leg and knee nontender, no calf tenderness bilaterally, DP and PT pulses difficult to palpate but both found easily on bedside doppler. NEURO: Awake, alert, lying in bed, speech mildly difficult to understand but responds appropriately to mother  ED Course  Procedures (including critical care time) Labs Review Labs Reviewed - No data to display  Imaging Review Dg Ankle Complete Left  11/13/2013   CLINICAL DATA:  Left foot and ankle pain, cerebral palsy  EXAM: LEFT ANKLE COMPLETE - 3+ VIEW  COMPARISON:  11/14/2010  FINDINGS: Postop changes of the left distal tibia  anteriorly. Normal ankle alignment without fracture. No significant arthropathy or degenerative process. Distal tibia, fibula, talus and calcaneus appear intact.  IMPRESSION: No acute osseous finding.   Electronically Signed   By: Ruel Favorsrevor  Shick M.D.   On: 11/13/2013 12:11   [place] Venous Img Lower Unilateral Left  11/13/2013   CLINICAL DATA:  Left foot pain.  EXAM: LEFT LOWER EXTREMITY VENOUS DOPPLER ULTRASOUND  TECHNIQUE: Gray-scale sonography with graded compression, as well as color Doppler and duplex  ultrasound were performed to evaluate the lower extremity deep venous systems from the level of the common femoral vein and including the common femoral, femoral, profunda femoral, popliteal and calf veins including the posterior tibial, peroneal and gastrocnemius veins when visible. The superficial great saphenous vein was also interrogated. Spectral Doppler was utilized to evaluate flow at rest and with distal augmentation maneuvers in the common femoral, femoral and popliteal veins.  COMPARISON:  None.  FINDINGS: Common Femoral Vein: No evidence of thrombus. Normal compressibility, respiratory phasicity and response to augmentation.  Saphenofemoral Junction: No evidence of thrombus. Normal compressibility and flow on color Doppler imaging.  Profunda Femoral Vein: No evidence of thrombus. Normal compressibility and flow on color Doppler imaging.  Femoral Vein: No evidence of thrombus. Normal compressibility, respiratory phasicity and response to augmentation.  Popliteal Vein: No evidence of thrombus. Normal compressibility, respiratory phasicity and response to augmentation.  Calf Veins: No evidence of thrombus. Normal compressibility and flow on color Doppler imaging.  Superficial Great Saphenous Vein: No evidence of thrombus. Normal compressibility and flow on color Doppler imaging.  Venous Reflux:  None.  Other Findings: No evidence of superficial thrombophlebitis or abnormal fluid collection.  IMPRESSION: No evidence of left lower extremity deep venous thrombosis.   Electronically Signed   By: Irish LackGlenn  Yamagata M.D.   On: 11/13/2013 13:05   Dg Foot Complete Left  11/13/2013   CLINICAL DATA:  Foot and ankle pain, cerebral palsy  EXAM: LEFT FOOT - COMPLETE 3+ VIEW  COMPARISON:  11/13/2013  FINDINGS: Postop changes of the left distal tibia as before. Normal left foot alignment. No fracture evident. Sclerotic bone island in the left first metatarsal. Preserved joint spaces. No significant arthropathy. Soft tissue  abnormality.  IMPRESSION: No acute osseous finding   Electronically Signed   By: Ruel Favorsrevor  Shick M.D.   On: 11/13/2013 12:13     EKG Interpretation None     MDM   Final diagnoses:  None   Foot pain - DDx includes infection, fracture, DVT or PAD. No signs of infection as pt is afebrile and foot/ankle do not look red or inflamed. DVT possible given pt stays relatively immobile. LE duplex negative however. Left foot and ankle x-ray negative for fracture. DP and PT pulse present bilaterally with bedside doppler.  - Will d/c with vicodin q4-6 hours #15 - F/u with PCP.  Right side pain - No tenderness or abnormality on exam.  Nonacute.  - F/u with PCP.  Tachycardia and high bp - Possibly related to autonomic dysreflexia. No reported chest pain, dizziness, or fainting. H/o HTN. - On recheck: pulse 101, BP 159/101. Possibly related to pain.    Leona SingletonMaria T Tetsuo Coppola, MD PGY-2, [facility]     Leona SingletonMaria T Raeshaun Simson, MD 11/13/13 1333  Leona SingletonMaria T Braydon Kullman, MD 11/13/13 [phone]

## 2013-11-13 NOTE — ED Notes (Signed)
Pt to room 2 in w/c, caregiver reports pt c/o left foot pain x yesterday. Mom reports he was walking yesterday morning with his walker, later in the afternoon he c/o foot pain "all over" and is now unable to bear weight. No swelling or redness noted, pt denies any injury or trauma.

## 2013-11-13 NOTE — ED Provider Notes (Signed)
I have personally seen and examined the patient.  I have discussed the plan of care with the resident.  I have reviewed the documentation on PMH/FH/Soc. History.  I have reviewed the documentation of the resident and agree.   Joya Gaskinsonald W Ashliegh Parekh, MD 11/13/13 [phone]

## 2013-11-13 NOTE — ED Provider Notes (Signed)
Patient seen/examined in the Emergency Department in conjunction with Resident Physician Provider  Patient reports left foot pain, no trauma reported Exam : awake/alert, distal pulses intact, no skin changes to suggest cellulitis.  Tenderness noted to left ankle and distal fibula Plan: DVT study, if negative safe for d/c and PCP followup He has not had any recent CP per mother  Joya Gaskinsonald W Wallis Spizzirri, MD 11/13/13 1232

## 2013-11-13 NOTE — Discharge Instructions (Signed)
Caleb Hinton's foot pain is unlikely to be due to a deep venous clot, fracture, or infection, based on our exam findings and xray and venous duplex. He can take vicodin every 4-6 hours as needed and should follow up with his primary care doctor for further workup if this does not improve.  If he develops fever, chills, swelling/redness, or other concerns, seek immediate care.

## 2014-05-12 ENCOUNTER — Encounter: Payer: Self-pay | Admitting: Family Medicine

## 2014-06-09 ENCOUNTER — Encounter: Payer: Self-pay | Admitting: Family Medicine

## 2014-06-24 ENCOUNTER — Encounter: Payer: Self-pay | Admitting: Vascular Surgery

## 2014-06-24 ENCOUNTER — Other Ambulatory Visit: Payer: Self-pay

## 2014-06-24 DIAGNOSIS — Z01810 Encounter for preprocedural cardiovascular examination: Secondary | ICD-10-CM

## 2014-06-24 DIAGNOSIS — N184 Chronic kidney disease, stage 4 (severe): Secondary | ICD-10-CM

## 2014-07-16 ENCOUNTER — Ambulatory Visit: Payer: Managed Care, Other (non HMO) | Admitting: Vascular Surgery

## 2014-07-16 ENCOUNTER — Other Ambulatory Visit (HOSPITAL_COMMUNITY): Payer: Managed Care, Other (non HMO)

## 2014-07-16 ENCOUNTER — Encounter (HOSPITAL_COMMUNITY): Payer: Managed Care, Other (non HMO)

## 2014-07-28 ENCOUNTER — Ambulatory Visit: Payer: Managed Care, Other (non HMO) | Admitting: Vascular Surgery

## 2014-07-28 ENCOUNTER — Other Ambulatory Visit (HOSPITAL_COMMUNITY): Payer: Managed Care, Other (non HMO)

## 2014-07-28 ENCOUNTER — Encounter (HOSPITAL_COMMUNITY): Payer: Managed Care, Other (non HMO)

## 2014-07-29 NOTE — Discharge Instructions (Signed)

## 2014-07-30 ENCOUNTER — Encounter (HOSPITAL_COMMUNITY)
Admission: RE | Admit: 2014-07-30 | Discharge: 2014-07-30 | Disposition: A | Discharge status: Home / Self Care | Payer: Managed Care, Other (non HMO) | Source: Ambulatory Visit | Attending: Nephrology | Admitting: Nephrology

## 2014-07-30 DIAGNOSIS — D631 Anemia in chronic kidney disease: Secondary | ICD-10-CM | POA: Insufficient documentation

## 2014-07-30 DIAGNOSIS — N184 Chronic kidney disease, stage 4 (severe): Secondary | ICD-10-CM | POA: Diagnosis not present

## 2014-07-30 DIAGNOSIS — Z5181 Encounter for therapeutic drug level monitoring: Secondary | ICD-10-CM | POA: Diagnosis not present

## 2014-07-30 MED ORDER — EPOETIN ALFA 10000 UNIT/ML IJ SOLN
INTRAMUSCULAR | Status: AC
  Administered 2014-07-30: 10000 [IU]
  Filled 2014-07-30: qty 1

## 2014-07-30 MED ORDER — EPOETIN ALFA 10000 UNIT/ML IJ SOLN: 10000.0000 [IU] | INTRAMUSCULAR | Status: DC

## 2014-07-31 ENCOUNTER — Encounter (HOSPITAL_COMMUNITY): Payer: Managed Care, Other (non HMO)

## 2014-07-31 LAB — POCT HEMOGLOBIN-HEMACUE: HEMOGLOBIN: 9.8 g/dL — AB (ref 13.0–17.0)

## 2014-08-03 ENCOUNTER — Encounter: Payer: Self-pay | Admitting: Vascular Surgery

## 2014-08-04 ENCOUNTER — Other Ambulatory Visit: Payer: Self-pay

## 2014-08-04 ENCOUNTER — Ambulatory Visit (INDEPENDENT_AMBULATORY_CARE_PROVIDER_SITE_OTHER): Payer: Managed Care, Other (non HMO) | Admitting: Vascular Surgery

## 2014-08-04 ENCOUNTER — Encounter: Payer: Self-pay | Admitting: Vascular Surgery

## 2014-08-04 ENCOUNTER — Ambulatory Visit (INDEPENDENT_AMBULATORY_CARE_PROVIDER_SITE_OTHER)
Admission: RE | Admit: 2014-08-04 | Discharge: 2014-08-04 | Disposition: A | Discharge status: Home / Self Care | Payer: Managed Care, Other (non HMO) | Source: Ambulatory Visit | Attending: Vascular Surgery | Admitting: Vascular Surgery

## 2014-08-04 ENCOUNTER — Ambulatory Visit (HOSPITAL_COMMUNITY)
Admission: RE | Admit: 2014-08-04 | Discharge: 2014-08-04 | Disposition: A | Discharge status: Home / Self Care | Payer: Managed Care, Other (non HMO) | Source: Ambulatory Visit | Attending: Vascular Surgery | Admitting: Vascular Surgery

## 2014-08-04 VITALS — BP 152/95 | HR 108 | Ht 64.0 in | Wt 154.0 lb

## 2014-08-04 DIAGNOSIS — N184 Chronic kidney disease, stage 4 (severe): Secondary | ICD-10-CM | POA: Diagnosis present

## 2014-08-04 DIAGNOSIS — Z01810 Encounter for preprocedural cardiovascular examination: Secondary | ICD-10-CM | POA: Diagnosis not present

## 2014-08-04 NOTE — Progress Notes (Addendum)
HISTORY AND PHYSICAL     CC:  Needs permanent HD access Referring Provider:  Patel, Jay, MD  HPI: This is a 29 y.o. male who was referred by Dr. Patel with CKD.  He has a hx of ESRD s/p living related kidney transplant (from his father) in 1991.  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    He is on statin for his cholesterol.    He is being evaluated at [facility] for kidney transplantation.   Past Medical History  Diagnosis Date  . Renal disorder   . Cerebral palsy   . Reflux   . Hypertension   . ESRD (end stage renal disease)   . Seizure disorder     Cerebral  palsy  . Mental retardation     Mild with  cognitive skills at a 6-7 yr old level  . Anemia     in CKD   . UTI symptoms   . Neurogenic bladder   . Gastroesophageal reflux disease   . Secondary hyperparathyroidism, renal   . Decreased hearing of both ears   . History of nephrectomy     Past Surgical History  Procedure Laterality Date  . Vesicostomy    . Inguinal hernia repair    . Bladder closure vesicostomy    . Meatotomy    . Kidney transplant Left December 19, 1989    Kidney from his Father : [facility] [place]  . Tonsilectomy, adenoidectomy, bilateral myringotomy and tubes      Allergies  Allergen Reactions  . Beta Adrenergic Blockers Other (See Comments)    Tired  And  Fatigue  . Propranolol Hcl Other (See Comments)    Tired and Fatigue  . Eggs Or Egg-Derived Products   . Amoxicillin Rash  . Penicillins Rash    Current Outpatient Prescriptions  Medication Sig Dispense Refill  . amLODipine (NORVASC) 10 MG tablet Take 10 mg by mouth daily.    . azaTHIOprine (IMURAN) 50 MG tablet Take 50 mg by mouth daily.    . calcitRIOL (ROCALTROL) 0.25 MCG capsule Take 0.25 mcg by mouth daily.    . cefdinir (OMNICEF) 300  MG capsule Take 300 mg by mouth daily.    . furosemide (LASIX) 20 MG tablet Take 20 mg by mouth daily.    . hydrALAZINE (APRESOLINE) 25 MG tablet Take 25 mg by mouth 2 (two) times daily.    . omeprazole (PRILOSEC) 20 MG capsule Take 20 mg by mouth daily.    . ondansetron (ZOFRAN-ODT) 4 MG disintegrating tablet Take 4 mg by mouth at bedtime.    . prednisoLONE 5 MG TABS tablet Take 5 mg by mouth daily.    . solifenacin (VESICARE) 10 MG tablet Take by mouth daily.    . tacrolimus (PROGRAF) 1 MG capsule Take 1 mg by mouth as directed. 3 tablets in am, 2 tablets in pm    . atorvastatin (LIPITOR) 20 MG tablet Take 20 mg by mouth daily.    . calcium carbonate (TUMS EX) 750 MG chewable tablet Chew 1 tablet by mouth daily.    . Cholecalciferol (VITAMIN D) 2000 UNITS CAPS Take by mouth.    . HYDROcodone-acetaminophen (NORCO/VICODIN) 5-325 MG per tablet Take 1 tablet by mouth every 6 (six) hours as needed for moderate pain. (Patient   not taking: Reported on 08/04/2014) 15 tablet 0  . NITROFURANTOIN MONOHYD MACRO PO Take by mouth as directed.    . oxybutynin (OXYTROL) 3.9 MG/24HR Place 1 patch onto the skin every 3 (three) days.    . sulfamethoxazole-trimethoprim (BACTRIM DS,SEPTRA DS) 800-160 MG per tablet Take 1 tablet by mouth as directed.     No current facility-administered medications for this visit.    Family History  Problem Relation Age of Onset  . Thyroid cancer Father   . Cancer Father     Thyroid with MEN  2A  . Heart disease Maternal Grandfather   . Cancer Paternal Grandmother     Thyroid Cancer with MEN 2A    History   Social History  . Marital Status: Single    Spouse Name: N/A    Number of Children: N/A  . Years of Education: N/A   Occupational History  . Not on file.   Social History Main Topics  . Smoking status: Never Smoker   . Smokeless tobacco: Never Used  . Alcohol Use: No  . Drug Use: No  . Sexual Activity: Not on file   Other Topics Concern  . Not on file    Social History Narrative     ROS: [x] Positive   [ ] Negative   [ ] All sytems reviewed and are negative  Cardiovascular: [] chest pain/pressure [] palpitations [] SOB lying flat [] DOE [] pain in legs while walking [] pain in feet when lying flat [] hx of DVT [] hx of phlebitis [x] swelling in legs [] varicose veins  Pulmonary: [] productive cough [] asthma [] wheezing  Neurologic: [] weakness in [] arms [] legs [] numbness in [] arms [] legs []difficulty speaking or slurred speech [] temporary loss of vision in one eye [] dizziness  Hematologic: [] bleeding problems [] problems with blood clotting easily [x] anemia  GI [] vomiting blood [] blood in stool  GU: [] burning with urination [] blood in urine [x]  CKD with hx of kidney transplant in 1991  Psychiatric: [] hx of major depression  Integumentary: [] rashes [] ulcers  Constitutional: [] fever [] chills   PHYSICAL EXAMINATION:  Filed Vitals:   08/04/14 1449  BP: 152/95  Pulse: 108   Body mass index is 26.42 kg/(m^2).  General:  WDWN in NAD Gait: Not observed-in wheelchair HENT: WNL, normocephalic Pulmonary: normal non-labored breathing  Cardiac: RRR Skin: without rashes, without ulcers  Vascular Exam/Pulses:  Right Left  Radial 2+ (normal) 2+ (normal)  Brachial 2+ (normal) 2+ (normal)   Extremities: without ischemic changes, without Gangrene , without cellulitis; without open wounds;  Musculoskeletal: no muscle wasting or atrophy  Neurologic: A&O X 3; Appropriate Affect ; SENSATION: normal; MOTOR FUNCTION:  moving all extremities equally. Speech is fluent/normal   Non-Invasive Vascular Imaging BUE vein mapping 08/04/14: Right Cephalic vein ranges from 0.37mm-0.5mm (not visible in the right forearm) Left Cephalic vein ranges from 0.18mm-0.42mm  BUE arterial duplex eval prior to HD access creation 08/04/14: 1.  Patent arteries of the BUE.  The left brachial artery has a high  bifurcation in the mid upper arm.   Pt meds includes: Statin:  Yes.   Beta Blocker:  No. Aspirin:  No. ACEI:  No. ARB:  No. Other Antiplatelet/Anticoagulant:  No.    ASSESSMENT/PLAN:: 28 y.o. male with CKD in need of permanent HD access who is left hand dominant.   -we will plan for a right AV   fistula placement on Friday, 08/07/14. -details of the surgery discussed by Dr. Early to pt and his mother.  They wish to proceed with surgery.   Dempsey Knotek, PA-C [facility] [phone]  Clinic MD:  Pt seen and examined in conjunction with Dr. Early  I have examined the patient, reviewed and agree with above. Patient is left-handed. Have recommended right upper arm AV fistula creation. Discussed with the patient and his mother present. Recommend general anesthesia due to his mental retardation.  EARLY, TODD, MD 08/04/2014 3:55 PM  

## 2014-08-04 NOTE — Addendum Note (Signed)
Addended by: Lorilei Horan F on: 08/04/2014 03:56 PM   Modules accepted: Level of Service

## 2014-08-06 ENCOUNTER — Encounter (HOSPITAL_COMMUNITY): Payer: Self-pay | Admitting: *Deleted

## 2014-08-06 ENCOUNTER — Encounter (HOSPITAL_COMMUNITY): Payer: Managed Care, Other (non HMO)

## 2014-08-06 MED ORDER — VANCOMYCIN HCL IN DEXTROSE 1-5 GM/200ML-% IV SOLN
1000.0000 mg | INTRAVENOUS | Status: AC
  Administered 2014-08-07: 1000 mg via INTRAVENOUS
  Filled 2014-08-06: qty 200

## 2014-08-07 ENCOUNTER — Ambulatory Visit (HOSPITAL_COMMUNITY): Payer: Managed Care, Other (non HMO) | Admitting: Certified Registered Nurse Anesthetist

## 2014-08-07 ENCOUNTER — Ambulatory Visit (HOSPITAL_COMMUNITY)
Admission: RE | Admit: 2014-08-07 | Discharge: 2014-08-07 | Disposition: A | Discharge status: Home / Self Care | Payer: Managed Care, Other (non HMO) | Source: Ambulatory Visit | Attending: Nephrology | Admitting: Nephrology

## 2014-08-07 ENCOUNTER — Other Ambulatory Visit: Payer: Self-pay | Admitting: *Deleted

## 2014-08-07 ENCOUNTER — Telehealth: Payer: Self-pay | Admitting: Vascular Surgery

## 2014-08-07 ENCOUNTER — Encounter (HOSPITAL_COMMUNITY)
Admission: RE | Disposition: A | Discharge status: Home / Self Care | Payer: Self-pay | Source: Ambulatory Visit | Attending: Vascular Surgery

## 2014-08-07 ENCOUNTER — Encounter (HOSPITAL_COMMUNITY): Payer: Self-pay

## 2014-08-07 ENCOUNTER — Ambulatory Visit (HOSPITAL_COMMUNITY)
Admission: RE | Admit: 2014-08-07 | Discharge: 2014-08-07 | Disposition: A | Discharge status: Home / Self Care | Payer: Managed Care, Other (non HMO) | Source: Ambulatory Visit | Attending: Vascular Surgery | Admitting: Vascular Surgery

## 2014-08-07 DIAGNOSIS — Z905 Acquired absence of kidney: Secondary | ICD-10-CM | POA: Diagnosis not present

## 2014-08-07 DIAGNOSIS — R000 Tachycardia, unspecified: Secondary | ICD-10-CM | POA: Diagnosis not present

## 2014-08-07 DIAGNOSIS — F79 Unspecified intellectual disabilities: Secondary | ICD-10-CM | POA: Insufficient documentation

## 2014-08-07 DIAGNOSIS — I120 Hypertensive chronic kidney disease with stage 5 chronic kidney disease or end stage renal disease: Secondary | ICD-10-CM | POA: Diagnosis not present

## 2014-08-07 DIAGNOSIS — N185 Chronic kidney disease, stage 5: Secondary | ICD-10-CM

## 2014-08-07 DIAGNOSIS — G809 Cerebral palsy, unspecified: Secondary | ICD-10-CM | POA: Diagnosis not present

## 2014-08-07 DIAGNOSIS — N186 End stage renal disease: Secondary | ICD-10-CM | POA: Diagnosis not present

## 2014-08-07 DIAGNOSIS — N2581 Secondary hyperparathyroidism of renal origin: Secondary | ICD-10-CM | POA: Diagnosis not present

## 2014-08-07 DIAGNOSIS — Z4931 Encounter for adequacy testing for hemodialysis: Secondary | ICD-10-CM

## 2014-08-07 DIAGNOSIS — H9190 Unspecified hearing loss, unspecified ear: Secondary | ICD-10-CM | POA: Diagnosis not present

## 2014-08-07 DIAGNOSIS — Z79899 Other long term (current) drug therapy: Secondary | ICD-10-CM | POA: Diagnosis not present

## 2014-08-07 HISTORY — PX: AV FISTULA PLACEMENT: SHX1204

## 2014-08-07 HISTORY — DX: Other specified health status: Z78.9

## 2014-08-07 LAB — POCT I-STAT 4, (NA,K, GLUC, HGB,HCT)
Glucose, Bld: 103 mg/dL — ABNORMAL HIGH (ref 70–99)
HEMATOCRIT: 31 % — AB (ref 39.0–52.0)
Hemoglobin: 10.5 g/dL — ABNORMAL LOW (ref 13.0–17.0)
Potassium: 4.6 mmol/L (ref 3.5–5.1)
SODIUM: 142 mmol/L (ref 135–145)

## 2014-08-07 SURGERY — ARTERIOVENOUS (AV) FISTULA CREATION
Anesthesia: General | Site: Arm Upper | Laterality: Right

## 2014-08-07 MED ORDER — HYDROCODONE-ACETAMINOPHEN 5-325 MG PO TABS: 1.0000 | ORAL_TABLET | Freq: Four times a day (QID) | ORAL | Status: DC | PRN

## 2014-08-07 MED ORDER — FENTANYL CITRATE 0.05 MG/ML IJ SOLN
INTRAMUSCULAR | Status: DC | PRN
  Administered 2014-08-07: 25 ug via INTRAVENOUS
  Administered 2014-08-07 (×2): 50 ug via INTRAVENOUS

## 2014-08-07 MED ORDER — CHLORHEXIDINE GLUCONATE CLOTH 2 % EX PADS: 6.0000 | MEDICATED_PAD | Freq: Once | CUTANEOUS | Status: DC

## 2014-08-07 MED ORDER — MIDAZOLAM HCL 5 MG/5ML IJ SOLN
INTRAMUSCULAR | Status: DC | PRN
  Administered 2014-08-07: 2 mg via INTRAVENOUS

## 2014-08-07 MED ORDER — HYDROCODONE-ACETAMINOPHEN 5-325 MG PO TABS: 1.0000 | ORAL_TABLET | Freq: Four times a day (QID) | ORAL | Status: AC | PRN

## 2014-08-07 MED ORDER — EPOETIN ALFA 10000 UNIT/ML IJ SOLN
INTRAMUSCULAR | Status: AC
  Filled 2014-08-07: qty 1

## 2014-08-07 MED ORDER — ARTIFICIAL TEARS OP OINT
TOPICAL_OINTMENT | OPHTHALMIC | Status: DC | PRN
  Administered 2014-08-07: 1 via OPHTHALMIC

## 2014-08-07 MED ORDER — LIDOCAINE HCL (CARDIAC) 20 MG/ML IV SOLN
INTRAVENOUS | Status: DC | PRN
  Administered 2014-08-07: 60 mg via INTRAVENOUS

## 2014-08-07 MED ORDER — SODIUM CHLORIDE 0.9 % IV SOLN
INTRAVENOUS | Status: DC
Start: 2014-08-07 — End: 2014-08-07
  Administered 2014-08-07 (×2): via INTRAVENOUS

## 2014-08-07 MED ORDER — LIDOCAINE-EPINEPHRINE 0.5 %-1:200000 IJ SOLN
INTRAMUSCULAR | Status: AC
  Filled 2014-08-07: qty 1

## 2014-08-07 MED ORDER — LIDOCAINE-EPINEPHRINE 0.5 %-1:200000 IJ SOLN
INTRAMUSCULAR | Status: DC | PRN
  Administered 2014-08-07: 6 mL

## 2014-08-07 MED ORDER — MIDAZOLAM HCL 2 MG/2ML IJ SOLN
INTRAMUSCULAR | Status: AC
  Filled 2014-08-07: qty 2

## 2014-08-07 MED ORDER — 0.9 % SODIUM CHLORIDE (POUR BTL) OPTIME
TOPICAL | Status: DC | PRN
  Administered 2014-08-07: 1000 mL

## 2014-08-07 MED ORDER — PROPOFOL 10 MG/ML IV BOLUS
INTRAVENOUS | Status: DC | PRN
  Administered 2014-08-07: 200 mg via INTRAVENOUS

## 2014-08-07 MED ORDER — SODIUM CHLORIDE 0.9 % IR SOLN
Status: DC | PRN
  Administered 2014-08-07: 500 mL

## 2014-08-07 MED ORDER — PHENYLEPHRINE 40 MCG/ML (10ML) SYRINGE FOR IV PUSH (FOR BLOOD PRESSURE SUPPORT)
PREFILLED_SYRINGE | INTRAVENOUS | Status: AC
  Filled 2014-08-07: qty 10

## 2014-08-07 MED ORDER — ONDANSETRON HCL 4 MG/2ML IJ SOLN: 4.0000 mg | Freq: Four times a day (QID) | INTRAMUSCULAR | Status: DC | PRN

## 2014-08-07 MED ORDER — ONDANSETRON HCL 4 MG/2ML IJ SOLN
INTRAMUSCULAR | Status: DC | PRN
  Administered 2014-08-07: 4 mg via INTRAVENOUS

## 2014-08-07 MED ORDER — EPOETIN ALFA 10000 UNIT/ML IJ SOLN
10000.0000 [IU] | INTRAMUSCULAR | Status: DC
  Administered 2014-08-07: 10000 [IU] via SUBCUTANEOUS

## 2014-08-07 MED ORDER — OXYCODONE HCL 5 MG PO TABS: 5.0000 mg | ORAL_TABLET | Freq: Once | ORAL | Status: DC | PRN

## 2014-08-07 MED ORDER — LIDOCAINE HCL (CARDIAC) 20 MG/ML IV SOLN
INTRAVENOUS | Status: AC
  Filled 2014-08-07: qty 5

## 2014-08-07 MED ORDER — OXYCODONE HCL 5 MG/5ML PO SOLN: 5.0000 mg | Freq: Once | ORAL | Status: DC | PRN

## 2014-08-07 MED ORDER — FENTANYL CITRATE 0.05 MG/ML IJ SOLN: 25.0000 ug | INTRAMUSCULAR | Status: DC | PRN

## 2014-08-07 MED ORDER — PROPOFOL 10 MG/ML IV BOLUS
INTRAVENOUS | Status: AC
  Filled 2014-08-07: qty 20

## 2014-08-07 MED ORDER — FENTANYL CITRATE 0.05 MG/ML IJ SOLN
INTRAMUSCULAR | Status: AC
  Filled 2014-08-07: qty 5

## 2014-08-07 SURGICAL SUPPLY — 39 items
ARMBAND PINK RESTRICT EXTREMIT (MISCELLANEOUS) ×2 IMPLANT
BENZOIN TINCTURE PRP APPL 2/3 (GAUZE/BANDAGES/DRESSINGS) ×2 IMPLANT
BLADE SURG 10 STRL SS (BLADE) ×2 IMPLANT
CANISTER SUCTION 2500CC (MISCELLANEOUS) ×2 IMPLANT
CANNULA VESSEL 3MM 2 BLNT TIP (CANNULA) IMPLANT
CLIP LIGATING EXTRA MED SLVR (CLIP) ×2 IMPLANT
CLIP LIGATING EXTRA SM BLUE (MISCELLANEOUS) ×2 IMPLANT
CLSR STERI-STRIP ANTIMIC 1/2X4 (GAUZE/BANDAGES/DRESSINGS) ×2 IMPLANT
COVER PROBE W GEL 5X96 (DRAPES) ×2 IMPLANT
COVER SURGICAL LIGHT HANDLE (MISCELLANEOUS) ×2 IMPLANT
DECANTER SPIKE VIAL GLASS SM (MISCELLANEOUS) ×2 IMPLANT
ELECT REM PT RETURN 9FT ADLT (ELECTROSURGICAL) ×2
ELECTRODE REM PT RTRN 9FT ADLT (ELECTROSURGICAL) ×1 IMPLANT
GAUZE SPONGE 4X4 12PLY STRL (GAUZE/BANDAGES/DRESSINGS) ×2 IMPLANT
GEL ULTRASOUND 20GR AQUASONIC (MISCELLANEOUS) IMPLANT
GLOVE BIO SURGEON STRL SZ 6.5 (GLOVE) ×2 IMPLANT
GLOVE BIOGEL PI IND STRL 6.5 (GLOVE) ×2 IMPLANT
GLOVE BIOGEL PI IND STRL 7.0 (GLOVE) ×1 IMPLANT
GLOVE BIOGEL PI INDICATOR 6.5 (GLOVE) ×2
GLOVE BIOGEL PI INDICATOR 7.0 (GLOVE) ×1
GLOVE SS BIOGEL STRL SZ 7.5 (GLOVE) ×1 IMPLANT
GLOVE SUPERSENSE BIOGEL SZ 7.5 (GLOVE) ×1
GLOVE SURG SS PI 7.0 STRL IVOR (GLOVE) ×2 IMPLANT
GOWN STRL REUS W/ TWL LRG LVL3 (GOWN DISPOSABLE) ×3 IMPLANT
GOWN STRL REUS W/TWL LRG LVL3 (GOWN DISPOSABLE) ×3
KIT BASIN OR (CUSTOM PROCEDURE TRAY) ×2 IMPLANT
KIT ROOM TURNOVER OR (KITS) ×2 IMPLANT
NS IRRIG 1000ML POUR BTL (IV SOLUTION) ×2 IMPLANT
PACK CV ACCESS (CUSTOM PROCEDURE TRAY) ×2 IMPLANT
PAD ARMBOARD 7.5X6 YLW CONV (MISCELLANEOUS) ×4 IMPLANT
PROBE PENCIL 8 MHZ STRL DISP (MISCELLANEOUS) IMPLANT
SPONGE GAUZE 4X4 12PLY STER LF (GAUZE/BANDAGES/DRESSINGS) ×2 IMPLANT
STRIP CLOSURE SKIN 1/2X4 (GAUZE/BANDAGES/DRESSINGS) ×2 IMPLANT
SUT PROLENE 6 0 CC (SUTURE) ×2 IMPLANT
SUT VIC AB 3-0 SH 27 (SUTURE) ×1
SUT VIC AB 3-0 SH 27X BRD (SUTURE) ×1 IMPLANT
TAPE CLOTH SURG 4X10 WHT LF (GAUZE/BANDAGES/DRESSINGS) ×2 IMPLANT
UNDERPAD 30X30 INCONTINENT (UNDERPADS AND DIAPERS) ×2 IMPLANT
WATER STERILE IRR 1000ML POUR (IV SOLUTION) ×2 IMPLANT

## 2014-08-07 NOTE — Interval H&P Note (Signed)
History and Physical Interval Note:  08/07/2014 10:16 AM  Caleb Hinton  has presented today for surgery, with the diagnosis of Chronic renal insufficiency N18.9  The various methods of treatment have been discussed with the patient and family. After consideration of risks, benefits and other options for treatment, the patient has consented to  Procedure(s): ARTERIOVENOUS (AV) FISTULA CREATION (Right) as a surgical intervention .  The patient's history has been reviewed, patient examined, no change in status, stable for surgery.  I have reviewed the patient's chart and labs.  Questions were answered to the patient's satisfaction.     Mohanad Carsten

## 2014-08-07 NOTE — Anesthesia Preprocedure Evaluation (Addendum)
Anesthesia Evaluation  Patient identified by MRN, date of birth, ID band Patient awake    Reviewed: Allergy & Precautions, NPO status , Patient's Chart, lab work & pertinent test results  Airway Mallampati: II   Neck ROM: full    Dental  (+) Teeth Intact, Dental Advisory Given   Pulmonary neg pulmonary ROS,  breath sounds clear to auscultation        Cardiovascular hypertension, + dysrhythmias Supra Ventricular Tachycardia Rhythm:regular Rate:Tachycardia     Neuro/Psych Seizures -,  Mental retardation.  Cerebral palsy    GI/Hepatic GERD-  ,  Endo/Other    Renal/GU ESRFRenal disease     Musculoskeletal   Abdominal   Peds  Hematology   Anesthesia Other Findings   Reproductive/Obstetrics                           Anesthesia Physical Anesthesia Plan  ASA: II  Anesthesia Plan: General   Post-op Pain Management:    Induction: Intravenous  Airway Management Planned: LMA  Additional Equipment:   Intra-op Plan:   Post-operative Plan:   Informed Consent: I have reviewed the patients History and Physical, chart, labs and discussed the procedure including the risks, benefits and alternatives for the proposed anesthesia with the patient or authorized representative who has indicated his/her understanding and acceptance.     Plan Discussed with: CRNA, Anesthesiologist and Surgeon  Anesthesia Plan Comments:         Anesthesia Quick Evaluation

## 2014-08-07 NOTE — Op Note (Signed)
    OPERATIVE REPORT  DATE OF SURGERY: 08/07/2014  PATIENT: Caleb CockingCory E Tieken, 29 y.o. male MRN: [id number]  DOB: 11/20/1985  PRE-OPERATIVE DIAGNOSIS: Chronic renal insufficiency  POST-OPERATIVE DIAGNOSIS:  Same  PROCEDURE: Right upper arm brachiocephalic AV fistula creation  SURGEON:  Gretta Beganodd Deborah Lazcano, M.D.  PHYSICIAN ASSISTANT: Trinh  ANESTHESIA:  Gen.  EBL: Minimal ml  Total I/O In: 500 [I.V.:500] Out: -   BLOOD ADMINISTERED: None  DRAINS: None  SPECIMEN: None  COUNTS CORRECT:  YES  PLAN OF CARE: PACU   PATIENT DISPOSITION:  PACU - hemodynamically stable  PROCEDURE DETAILS: The patient was taking the OR and placed in the supine position where the area the right arm was prepped and draped in usual sterile fashion. SonoSite ultrasound was used to visualize the cephalic vein which was very nice caliber at the antecubital space and proximal. The patient was under general seizure. Local anesthesia was also used for incision at the level of the antecubital space. This was secured down to the subcutaneous tissue with electrocautery to the level of the brachial artery which was of excellent caliber. Antecubital vein was identified through the same incision this also was excellent caliber. Curvature branches of the vein were ligated with 3 or 4 silk ties and divided. The vein was mobilized proximally and distally through this incision. The vein was ligated distally with 2-0 silk ties and divided. The vein was brought into approximation with the brachial artery. The vein was gently dilated with heparinized saline and reoccluded. The brachial artery was occluded proximally and distally with fistula clamps and was opened with 11 blade incision longitudinally with Potts scissors. The vein was brought into approximation with this. The vein was cut to appropriate length and was spatulated and sewn end-to-side to the artery with a running 6-0 Prolene suture. Prior to completion of the anastomosis the  usual flushing maneuvers were undertaken. Anastomosis completed and clamps were removed. There was excellent thrill in the fistula and the patient maintained a 2+ right radial pulse. The wound irrigated with saline. Hemostasis tablet cautery. Wounds closed with 3-0 Vicryl in the subcutaneous and subcuticular tissue. Benzoin insertion for applied. The patient was transferred to the recovery room stable condition    Gretta Beganodd Melonee Gerstel, M.D. 08/07/2014 2:46 PM

## 2014-08-07 NOTE — Discharge Instructions (Signed)
What to eat: ° °For your first meals, you should eat lightly; only small meals initially.  If you do not have nausea, you may eat larger meals.  Avoid spicy, greasy and heavy food.   ° °General Anesthesia, Adult, Care After  °Refer to this sheet in the next few weeks. These instructions provide you with information on caring for yourself after your procedure. Your health care provider may also give you more specific instructions. Your treatment has been planned according to current medical practices, but problems sometimes occur. Call your health care provider if you have any problems or questions after your procedure.  °WHAT TO EXPECT AFTER THE PROCEDURE  °After the procedure, it is typical to experience:  °Sleepiness.  °Nausea and vomiting. °HOME CARE INSTRUCTIONS  °For the first 24 hours after general anesthesia:  °Have a responsible person with you.  °Do not drive a car. If you are alone, do not take public transportation.  °Do not drink alcohol.  °Do not take medicine that has not been prescribed by your health care provider.  °Do not sign important papers or make important decisions.  °You may resume a normal diet and activities as directed by your health care provider.  °Change bandages (dressings) as directed.  °If you have questions or problems that seem related to general anesthesia, call the hospital and ask for the anesthetist or anesthesiologist on call. °SEEK MEDICAL CARE IF:  °You have nausea and vomiting that continue the day after anesthesia.  °You develop a rash. °SEEK IMMEDIATE MEDICAL CARE IF:  °You have difficulty breathing.  °You have chest pain.  °You have any allergic problems. °Document Released: 10/02/2000 Document Revised: 02/26/2013 Document Reviewed: 01/09/2013  °ExitCare® Patient Information ©2014 [employer].  ° °Tissue Adhesive Wound Care  ° ° °Some cuts and wounds can be closed with tissue adhesive. Adhesive is like glue. It holds the skin together and helps a wound heal faster.  This adhesive goes away on its own as the wound heals.  °HOME CARE  °Showers are allowed. Do not soak the wound in water. Do not take baths, swim, or use hot tubs. Do not use soaps or creams on your wound.  °If a bandage (dressing) was put on, change it as often as told by your doctor.  °Keep the bandage dry.  °Do not scratch, pick, or rub the adhesive.  °Do not put tape over the adhesive. The adhesive could come off.  °Protect the wound from another injury.  °Protect the wound from sun and tanning beds.  °Only take medicine as told by your doctor.  °Keep all doctor visits as told. °GET HELP RIGHT AWAY IF:  °Your wound is red, puffy (swollen), hot, or tender.  °You get a rash after the glue is put on.  °You have more pain in the wound.  °You have a red streak going away from the wound.  °You have yellowish-white fluid (pus) coming from the wound.  °You have more bleeding.  °You have a fever.  °You have chills and start to shake.  °You notice a bad smell coming from the wound.  °Your wound or adhesive breaks open. °MAKE SURE YOU:  °Understand these instructions.  °Will watch your condition.  °Will get help right away if you are not doing well or get worse. °Document Released: 04/04/2008 Document Revised: 04/16/2013 Document Reviewed: 01/15/2013  °ExitCare® Patient Information ©2015 [employer]. This information is not intended to replace advice given to you by your health care provider.   Make sure you discuss any questions you have with your health care provider.  ° ° °

## 2014-08-07 NOTE — Telephone Encounter (Signed)
-----   Message from Sharee PimpleMarilyn K McChesney, RN sent at 08/07/2014  2:26 PM EST ----- Regarding: Schedule   ----- Message -----    From: Lars MageEmma M Collins, PA-C    Sent: 08/07/2014   1:58 PM      To: [facility] Charge Pool  F/U in 6 weeks with Dr. Arbie CookeyEarly s/p right arm av fistula needs duplex of fistula

## 2014-08-07 NOTE — Telephone Encounter (Signed)
Spoke with patients mother, she will call back when she has her calendar in front of her, dpm

## 2014-08-07 NOTE — H&P (View-Only) (Signed)
HISTORY AND PHYSICAL     CC:  Needs permanent HD access Referring Provider:  Zetta Bills, MD  HPI: This is a 29 y.o. male who was referred by Dr. Allena Katz with CKD.  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  He has a hx of cerebral palsy and mild mental retardation with cognitive skills at a 57-49 year old level requiring on his mother for care.    He is on statin for his cholesterol.    He is being evaluated at [facility] for kidney transplantation.   Past Medical History  Diagnosis Date  . Renal disorder   . Cerebral palsy   . Reflux   . Hypertension   . ESRD (end stage renal disease)   . Seizure disorder     Cerebral  palsy  . Mental retardation     Mild with  cognitive skills at a 77-7 yr old level  . Anemia     in CKD   . UTI symptoms   . Neurogenic bladder   . Gastroesophageal reflux disease   . Secondary hyperparathyroidism, renal   . Decreased hearing of both ears   . History of nephrectomy     Past Surgical History  Procedure Laterality Date  . Vesicostomy    . Inguinal hernia repair    . Bladder closure vesicostomy    . Meatotomy    . Kidney transplant Left December 19, 1989    Kidney from his Father : [facility] [place], [place]  . Tonsilectomy, adenoidectomy, bilateral myringotomy and tubes      Allergies  Allergen Reactions  . Beta Adrenergic Blockers Other (See Comments)    Tired  And  Fatigue  . Propranolol Hcl Other (See Comments)    Tired and Fatigue  . Eggs Or Egg-Derived Products   . Amoxicillin Rash  . Penicillins Rash    Current Outpatient Prescriptions  Medication Sig Dispense Refill  . amLODipine (NORVASC) 10 MG tablet Take 10 mg by mouth daily.    Marland Kitchen azaTHIOprine (IMURAN) 50 MG tablet Take 50 mg by mouth daily.    . calcitRIOL (ROCALTROL) 0.25 MCG capsule Take 0.25 mcg by mouth daily.    . cefdinir (OMNICEF) 300  MG capsule Take 300 mg by mouth daily.    . furosemide (LASIX) 20 MG tablet Take 20 mg by mouth daily.    . hydrALAZINE (APRESOLINE) 25 MG tablet Take 25 mg by mouth 2 (two) times daily.    Marland Kitchen omeprazole (PRILOSEC) 20 MG capsule Take 20 mg by mouth daily.    . ondansetron (ZOFRAN-ODT) 4 MG disintegrating tablet Take 4 mg by mouth at bedtime.    . prednisoLONE 5 MG TABS tablet Take 5 mg by mouth daily.    . solifenacin (VESICARE) 10 MG tablet Take by mouth daily.    . tacrolimus (PROGRAF) 1 MG capsule Take 1 mg by mouth as directed. 3 tablets in am, 2 tablets in pm    . atorvastatin (LIPITOR) 20 MG tablet Take 20 mg by mouth daily.    . calcium carbonate (TUMS EX) 750 MG chewable tablet Chew 1 tablet by mouth daily.    . Cholecalciferol (VITAMIN D) 2000 UNITS CAPS Take by mouth.    Marland Kitchen HYDROcodone-acetaminophen (NORCO/VICODIN) 5-325 MG per tablet Take 1 tablet by mouth every 6 (six) hours as needed for moderate pain. (Patient  not taking: Reported on 08/04/2014) 15 tablet 0  . NITROFURANTOIN MONOHYD MACRO PO Take by mouth as directed.    Marland Kitchen. oxybutynin (OXYTROL) 3.9 MG/24HR Place 1 patch onto the skin every 3 (three) days.    Marland Kitchen. sulfamethoxazole-trimethoprim (BACTRIM DS,SEPTRA DS) 800-160 MG per tablet Take 1 tablet by mouth as directed.     No current facility-administered medications for this visit.    Family History  Problem Relation Age of Onset  . Thyroid cancer Father   . Cancer Father     Thyroid with MEN  2A  . Heart disease Maternal Grandfather   . Cancer Paternal Grandmother     Thyroid Cancer with MEN 2A    History   Social History  . Marital Status: Single    Spouse Name: N/A    Number of Children: N/A  . Years of Education: N/A   Occupational History  . Not on file.   Social History Main Topics  . Smoking status: Never Smoker   . Smokeless tobacco: Never Used  . Alcohol Use: No  . Drug Use: No  . Sexual Activity: Not on file   Other Topics Concern  . Not on file    Social History Narrative     ROS: [x]  Positive   [ ]  Negative   [ ]  All sytems reviewed and are negative  Cardiovascular: []  chest pain/pressure []  palpitations []  SOB lying flat []  DOE []  pain in legs while walking []  pain in feet when lying flat []  hx of DVT []  hx of phlebitis [x]  swelling in legs []  varicose veins  Pulmonary: []  productive cough []  asthma []  wheezing  Neurologic: []  weakness in []  arms []  legs []  numbness in []  arms []  legs [] difficulty speaking or slurred speech []  temporary loss of vision in one eye []  dizziness  Hematologic: []  bleeding problems []  problems with blood clotting easily [x]  anemia  GI []  vomiting blood []  blood in stool  GU: []  burning with urination []  blood in urine [x]   CKD with hx of kidney transplant in 1991  Psychiatric: []  hx of major depression  Integumentary: []  rashes []  ulcers  Constitutional: []  fever []  chills   PHYSICAL EXAMINATION:  Filed Vitals:   08/04/14 1449  BP: 152/95  Pulse: 108   Body mass index is 26.42 kg/(m^2).  General:  WDWN in NAD Gait: Not observed-in wheelchair HENT: WNL, normocephalic Pulmonary: normal non-labored breathing  Cardiac: RRR Skin: without rashes, without ulcers  Vascular Exam/Pulses:  Right Left  Radial 2+ (normal) 2+ (normal)  Brachial 2+ (normal) 2+ (normal)   Extremities: without ischemic changes, without Gangrene , without cellulitis; without open wounds;  Musculoskeletal: no muscle wasting or atrophy  Neurologic: A&O X 3; Appropriate Affect ; SENSATION: normal; MOTOR FUNCTION:  moving all extremities equally. Speech is fluent/normal   Non-Invasive Vascular Imaging BUE vein mapping 08/04/14: Right Cephalic vein ranges from 0.2437mm-0.5mm (not visible in the right forearm) Left Cephalic vein ranges from 0.6818mm-0.42mm  BUE arterial duplex eval prior to HD access creation 08/04/14: 1.  Patent arteries of the BUE.  The left brachial artery has a high  bifurcation in the mid upper arm.   Pt meds includes: Statin:  Yes.   Beta Blocker:  No. Aspirin:  No. ACEI:  No. ARB:  No. Other Antiplatelet/Anticoagulant:  No.    ASSESSMENT/PLAN:: 29 y.o. male with CKD in need of permanent HD access who is left hand dominant.   -we will plan for a right AV  fistula placement on Friday, 08/07/14. -details of the surgery discussed by Dr. Arbie Cookey to pt and his mother.  They wish to proceed with surgery.   Doreatha Massed, PA-C [facility] [phone]  Clinic MD:  Pt seen and examined in conjunction with Dr. Arbie Cookey  I have examined the patient, reviewed and agree with above. Patient is left-handed. Have recommended right upper arm AV fistula creation. Discussed with the patient and his mother present. Recommend general anesthesia due to his mental retardation.  EARLY, TODD, MD 08/04/2014 3:55 PM

## 2014-08-07 NOTE — Transfer of Care (Signed)
Immediate Anesthesia Transfer of Care Note  Patient: Caleb Hinton  Procedure(s) Performed: Procedure(s): BRACHIOCEPHALIC ARTERIOVENOUS (AV) FISTULA CREATION (Right)  Patient Location: PACU  Anesthesia Type:General  Level of Consciousness: awake and alert   Airway & Oxygen Therapy: Patient Spontanous Breathing and Patient connected to nasal cannula oxygen  Post-op Assessment: Report given to RN and Post -op Vital signs reviewed and stable  Post vital signs: Reviewed and stable  Last Vitals:  Filed Vitals:   08/07/14 1438  BP: 144/80  Pulse: 132  Temp: 36.9 C  Resp: 22    Complications: No apparent anesthesia complications

## 2014-08-10 ENCOUNTER — Encounter (HOSPITAL_COMMUNITY): Payer: Self-pay | Admitting: Vascular Surgery

## 2014-08-10 NOTE — Anesthesia Postprocedure Evaluation (Signed)
Anesthesia Post Note  Patient: Caleb Hinton  Procedure(s) Performed: Procedure(s) (LRB): BRACHIOCEPHALIC ARTERIOVENOUS (AV) FISTULA CREATION (Right)  Anesthesia type: General  Patient location: PACU  Post pain: Pain level controlled and Adequate analgesia  Post assessment: Post-op Vital signs reviewed, Patient's Cardiovascular Status Stable, Respiratory Function Stable, Patent Airway and Pain level controlled  Last Vitals:  Filed Vitals:   08/07/14 1518  BP: 147/83  Pulse: 113  Temp:   Resp:     Post vital signs: Reviewed and stable  Level of consciousness: awake, alert  and oriented  Complications: No apparent anesthesia complications

## 2014-08-17 ENCOUNTER — Encounter (HOSPITAL_COMMUNITY): Payer: Managed Care, Other (non HMO)

## 2014-08-20 ENCOUNTER — Encounter (HOSPITAL_COMMUNITY)
Admission: RE | Admit: 2014-08-20 | Discharge: 2014-08-20 | Disposition: A | Discharge status: Home / Self Care | Payer: Managed Care, Other (non HMO) | Source: Ambulatory Visit | Attending: Nephrology | Admitting: Nephrology

## 2014-08-20 DIAGNOSIS — N184 Chronic kidney disease, stage 4 (severe): Secondary | ICD-10-CM | POA: Diagnosis not present

## 2014-08-20 DIAGNOSIS — D631 Anemia in chronic kidney disease: Secondary | ICD-10-CM | POA: Diagnosis present

## 2014-08-20 DIAGNOSIS — Z5181 Encounter for therapeutic drug level monitoring: Secondary | ICD-10-CM | POA: Insufficient documentation

## 2014-08-20 LAB — RENAL FUNCTION PANEL
ALBUMIN: 3.8 g/dL (ref 3.5–5.2)
ANION GAP: 11 (ref 5–15)
BUN: 70 mg/dL — AB (ref 6–23)
CALCIUM: 9.5 mg/dL (ref 8.4–10.5)
CO2: 19 mmol/L (ref 19–32)
CREATININE: 3.92 mg/dL — AB (ref 0.50–1.35)
Chloride: 113 mmol/L — ABNORMAL HIGH (ref 96–112)
GFR calc Af Amer: 23 mL/min — ABNORMAL LOW (ref >90)
GFR calc non Af Amer: 20 mL/min — ABNORMAL LOW (ref >90)
GLUCOSE: 90 mg/dL (ref 70–99)
PHOSPHORUS: 3.3 mg/dL (ref 2.3–4.6)
Potassium: 4.8 mmol/L (ref 3.5–5.1)
Sodium: 143 mmol/L (ref 135–145)

## 2014-08-20 LAB — POCT HEMOGLOBIN-HEMACUE: Hemoglobin: 10.1 g/dL — ABNORMAL LOW (ref 13.0–17.0)

## 2014-08-20 MED ORDER — EPOETIN ALFA 10000 UNIT/ML IJ SOLN
10000.0000 [IU] | INTRAMUSCULAR | Status: DC
  Administered 2014-08-20: 10000 [IU] via SUBCUTANEOUS

## 2014-08-21 LAB — IRON AND TIBC
Iron: 82 ug/dL (ref 42–165)
SATURATION RATIOS: 32 % (ref 20–55)
TIBC: 257 ug/dL (ref 215–435)
UIBC: 175 ug/dL (ref 125–400)

## 2014-08-21 LAB — FERRITIN: Ferritin: 36 ng/mL (ref 22–322)

## 2014-08-21 MED ORDER — EPOETIN ALFA 10000 UNIT/ML IJ SOLN
INTRAMUSCULAR | Status: AC
  Filled 2014-08-21: qty 1

## 2014-08-27 ENCOUNTER — Encounter (HOSPITAL_COMMUNITY)
Admission: RE | Admit: 2014-08-27 | Discharge: 2014-08-27 | Disposition: A | Discharge status: Home / Self Care | Payer: Managed Care, Other (non HMO) | Source: Ambulatory Visit | Attending: Nephrology | Admitting: Nephrology

## 2014-08-27 DIAGNOSIS — D631 Anemia in chronic kidney disease: Secondary | ICD-10-CM | POA: Diagnosis not present

## 2014-08-27 LAB — MAGNESIUM: MAGNESIUM: 1.8 mg/dL (ref 1.5–2.5)

## 2014-08-27 LAB — POCT HEMOGLOBIN-HEMACUE: HEMOGLOBIN: 10.9 g/dL — AB (ref 13.0–17.0)

## 2014-08-27 MED ORDER — EPOETIN ALFA 10000 UNIT/ML IJ SOLN
INTRAMUSCULAR | Status: AC
  Filled 2014-08-27: qty 1

## 2014-08-27 MED ORDER — EPOETIN ALFA 10000 UNIT/ML IJ SOLN
10000.0000 [IU] | INTRAMUSCULAR | Status: DC
  Administered 2014-08-27: 10000 [IU] via SUBCUTANEOUS

## 2014-08-27 NOTE — Progress Notes (Signed)
Pt came in today [facility]  We called Dr Allena KatzPatel and he said that we could draw that lab.

## 2014-09-03 ENCOUNTER — Encounter (HOSPITAL_COMMUNITY)
Admission: RE | Admit: 2014-09-03 | Discharge: 2014-09-03 | Disposition: A | Discharge status: Home / Self Care | Payer: Managed Care, Other (non HMO) | Source: Ambulatory Visit | Attending: Nephrology | Admitting: Nephrology

## 2014-09-03 ENCOUNTER — Encounter (HOSPITAL_COMMUNITY): Payer: Managed Care, Other (non HMO)

## 2014-09-03 DIAGNOSIS — D631 Anemia in chronic kidney disease: Secondary | ICD-10-CM | POA: Diagnosis not present

## 2014-09-03 LAB — POCT HEMOGLOBIN-HEMACUE: Hemoglobin: 10.8 g/dL — ABNORMAL LOW (ref 13.0–17.0)

## 2014-09-03 MED ORDER — EPOETIN ALFA 10000 UNIT/ML IJ SOLN
10000.0000 [IU] | INTRAMUSCULAR | Status: DC
  Administered 2014-09-03: 10000 [IU] via SUBCUTANEOUS

## 2014-09-04 MED ORDER — EPOETIN ALFA 10000 UNIT/ML IJ SOLN
INTRAMUSCULAR | Status: AC
  Filled 2014-09-04: qty 1

## 2014-09-10 ENCOUNTER — Encounter (HOSPITAL_COMMUNITY): Payer: Managed Care, Other (non HMO)

## 2014-09-10 ENCOUNTER — Encounter (HOSPITAL_COMMUNITY)
Admission: RE | Admit: 2014-09-10 | Discharge: 2014-09-10 | Disposition: A | Discharge status: Home / Self Care | Payer: Managed Care, Other (non HMO) | Source: Ambulatory Visit | Attending: Nephrology | Admitting: Nephrology

## 2014-09-10 DIAGNOSIS — N184 Chronic kidney disease, stage 4 (severe): Secondary | ICD-10-CM | POA: Diagnosis not present

## 2014-09-10 DIAGNOSIS — Z5181 Encounter for therapeutic drug level monitoring: Secondary | ICD-10-CM | POA: Insufficient documentation

## 2014-09-10 DIAGNOSIS — D631 Anemia in chronic kidney disease: Secondary | ICD-10-CM | POA: Diagnosis present

## 2014-09-10 LAB — POCT HEMOGLOBIN-HEMACUE: Hemoglobin: 10.8 g/dL — ABNORMAL LOW (ref 13.0–17.0)

## 2014-09-10 MED ORDER — EPOETIN ALFA 10000 UNIT/ML IJ SOLN
INTRAMUSCULAR | Status: AC
  Filled 2014-09-10: qty 1

## 2014-09-10 MED ORDER — EPOETIN ALFA 10000 UNIT/ML IJ SOLN
10000.0000 [IU] | INTRAMUSCULAR | Status: DC
  Administered 2014-09-10: 10000 [IU] via SUBCUTANEOUS

## 2014-09-14 ENCOUNTER — Encounter: Payer: Self-pay | Admitting: Vascular Surgery

## 2014-09-15 ENCOUNTER — Ambulatory Visit (HOSPITAL_COMMUNITY)
Admission: RE | Admit: 2014-09-15 | Discharge: 2014-09-15 | Disposition: A | Discharge status: Home / Self Care | Payer: Managed Care, Other (non HMO) | Source: Ambulatory Visit | Attending: Vascular Surgery | Admitting: Vascular Surgery

## 2014-09-15 ENCOUNTER — Ambulatory Visit (INDEPENDENT_AMBULATORY_CARE_PROVIDER_SITE_OTHER): Payer: Self-pay | Admitting: Vascular Surgery

## 2014-09-15 ENCOUNTER — Encounter: Payer: Managed Care, Other (non HMO) | Admitting: Vascular Surgery

## 2014-09-15 ENCOUNTER — Encounter: Payer: Self-pay | Admitting: Vascular Surgery

## 2014-09-15 ENCOUNTER — Other Ambulatory Visit (HOSPITAL_COMMUNITY): Payer: Managed Care, Other (non HMO)

## 2014-09-15 VITALS — BP 144/82 | HR 98 | Ht 64.0 in | Wt 154.0 lb

## 2014-09-15 DIAGNOSIS — Z4931 Encounter for adequacy testing for hemodialysis: Secondary | ICD-10-CM | POA: Diagnosis present

## 2014-09-15 DIAGNOSIS — N186 End stage renal disease: Secondary | ICD-10-CM | POA: Diagnosis not present

## 2014-09-15 DIAGNOSIS — N184 Chronic kidney disease, stage 4 (severe): Secondary | ICD-10-CM

## 2014-09-15 NOTE — Progress Notes (Signed)
Here today for follow-up of upper arm AV fistula creation by myself on 08/07/2014. He denies any steal symptoms. He is not on hemodialysis currently.  On physical exam his veins are well-perfused and he has 2-3+ radial pulses which are equal bilaterally. Has an excellent thrill in his upper arm AV fistula and good size maturation.  He underwent a duplex of this today. This does show the elevated velocities at the anastomosis probably related to change in configuration. He has excellent thrill and excellent size maturation from 8-9 mm throughout its course.  Impression and plan excellent [name] maturation of AV fistula. Should be available for use if needed anytime after another 4-6 weeks. He will see Caleb Hinton again on as-needed basis

## 2014-09-17 ENCOUNTER — Encounter (HOSPITAL_COMMUNITY)
Admission: RE | Admit: 2014-09-17 | Discharge: 2014-09-17 | Disposition: A | Discharge status: Home / Self Care | Payer: Managed Care, Other (non HMO) | Source: Ambulatory Visit | Attending: Nephrology | Admitting: Nephrology

## 2014-09-17 DIAGNOSIS — D631 Anemia in chronic kidney disease: Secondary | ICD-10-CM | POA: Diagnosis not present

## 2014-09-17 LAB — RENAL FUNCTION PANEL
ANION GAP: 10 (ref 5–15)
Albumin: 3.9 g/dL (ref 3.5–5.2)
BUN: 60 mg/dL — ABNORMAL HIGH (ref 6–23)
CHLORIDE: 107 mmol/L (ref 96–112)
CO2: 23 mmol/L (ref 19–32)
Calcium: 9.5 mg/dL (ref 8.4–10.5)
Creatinine, Ser: 4.69 mg/dL — ABNORMAL HIGH (ref 0.50–1.35)
GFR, EST AFRICAN AMERICAN: 18 mL/min — AB (ref >90)
GFR, EST NON AFRICAN AMERICAN: 16 mL/min — AB (ref >90)
GLUCOSE: 86 mg/dL (ref 70–99)
PHOSPHORUS: 3.9 mg/dL (ref 2.3–4.6)
POTASSIUM: 4.4 mmol/L (ref 3.5–5.1)
Sodium: 140 mmol/L (ref 135–145)

## 2014-09-17 LAB — IRON AND TIBC
Iron: 38 ug/dL — ABNORMAL LOW (ref 42–165)
Saturation Ratios: 13 % — ABNORMAL LOW (ref 20–55)
TIBC: 291 ug/dL (ref 215–435)
UIBC: 253 ug/dL (ref 125–400)

## 2014-09-17 LAB — FERRITIN: Ferritin: 13 ng/mL — ABNORMAL LOW (ref 22–322)

## 2014-09-17 MED ORDER — EPOETIN ALFA 10000 UNIT/ML IJ SOLN
INTRAMUSCULAR | Status: AC
  Filled 2014-09-17: qty 1

## 2014-09-17 MED ORDER — EPOETIN ALFA 10000 UNIT/ML IJ SOLN
10000.0000 [IU] | INTRAMUSCULAR | Status: DC
  Administered 2014-09-17: 10000 [IU] via SUBCUTANEOUS

## 2014-09-18 LAB — POCT HEMOGLOBIN-HEMACUE: Hemoglobin: 11.8 g/dL — ABNORMAL LOW (ref 13.0–17.0)

## 2014-09-24 ENCOUNTER — Encounter (HOSPITAL_COMMUNITY)
Admission: RE | Admit: 2014-09-24 | Discharge: 2014-09-24 | Disposition: A | Discharge status: Home / Self Care | Payer: Managed Care, Other (non HMO) | Source: Ambulatory Visit | Attending: Nephrology | Admitting: Nephrology

## 2014-09-24 DIAGNOSIS — D631 Anemia in chronic kidney disease: Secondary | ICD-10-CM | POA: Diagnosis not present

## 2014-09-24 LAB — POCT HEMOGLOBIN-HEMACUE: HEMOGLOBIN: 11.5 g/dL — AB (ref 13.0–17.0)

## 2014-09-24 LAB — MAGNESIUM: MAGNESIUM: 2 mg/dL (ref 1.5–2.5)

## 2014-09-24 MED ORDER — EPOETIN ALFA 10000 UNIT/ML IJ SOLN
10000.0000 [IU] | INTRAMUSCULAR | Status: DC
  Administered 2014-09-24: 10000 [IU] via SUBCUTANEOUS

## 2014-09-24 MED ORDER — EPOETIN ALFA 10000 UNIT/ML IJ SOLN
INTRAMUSCULAR | Status: AC
  Filled 2014-09-24: qty 1

## 2014-10-01 ENCOUNTER — Encounter (HOSPITAL_COMMUNITY): Payer: Managed Care, Other (non HMO)

## 2014-10-07 ENCOUNTER — Encounter (HOSPITAL_COMMUNITY)
Admission: RE | Admit: 2014-10-07 | Discharge: 2014-10-07 | Disposition: A | Discharge status: Home / Self Care | Payer: Managed Care, Other (non HMO) | Source: Ambulatory Visit | Attending: Nephrology | Admitting: Nephrology

## 2014-10-07 DIAGNOSIS — D631 Anemia in chronic kidney disease: Secondary | ICD-10-CM | POA: Diagnosis not present

## 2014-10-07 LAB — RENAL FUNCTION PANEL
Albumin: 3.9 g/dL (ref 3.5–5.2)
Anion gap: 12 (ref 5–15)
BUN: 67 mg/dL — ABNORMAL HIGH (ref 6–23)
CHLORIDE: 110 mmol/L (ref 96–112)
CO2: 20 mmol/L (ref 19–32)
CREATININE: 3.95 mg/dL — AB (ref 0.50–1.35)
Calcium: 9.9 mg/dL (ref 8.4–10.5)
GFR calc non Af Amer: 19 mL/min — ABNORMAL LOW (ref >90)
GFR, EST AFRICAN AMERICAN: 22 mL/min — AB (ref >90)
Glucose, Bld: 125 mg/dL — ABNORMAL HIGH (ref 70–99)
Phosphorus: 3.9 mg/dL (ref 2.3–4.6)
Potassium: 4.6 mmol/L (ref 3.5–5.1)
Sodium: 142 mmol/L (ref 135–145)

## 2014-10-07 LAB — POCT HEMOGLOBIN-HEMACUE: HEMOGLOBIN: 11.1 g/dL — AB (ref 13.0–17.0)

## 2014-10-07 MED ORDER — EPOETIN ALFA 10000 UNIT/ML IJ SOLN
INTRAMUSCULAR | Status: AC
  Administered 2014-10-07: 10000 [IU] via SUBCUTANEOUS
  Filled 2014-10-07: qty 1

## 2014-10-07 MED ORDER — EPOETIN ALFA 10000 UNIT/ML IJ SOLN
10000.0000 [IU] | INTRAMUSCULAR | Status: DC
  Administered 2014-10-07: 10000 [IU] via SUBCUTANEOUS

## 2014-10-14 ENCOUNTER — Encounter (HOSPITAL_COMMUNITY)
Admission: RE | Admit: 2014-10-14 | Discharge: 2014-10-14 | Disposition: A | Discharge status: Home / Self Care | Payer: Managed Care, Other (non HMO) | Source: Ambulatory Visit | Attending: Nephrology | Admitting: Nephrology

## 2014-10-14 DIAGNOSIS — D631 Anemia in chronic kidney disease: Secondary | ICD-10-CM | POA: Insufficient documentation

## 2014-10-14 DIAGNOSIS — N184 Chronic kidney disease, stage 4 (severe): Secondary | ICD-10-CM | POA: Insufficient documentation

## 2014-10-14 DIAGNOSIS — Z5181 Encounter for therapeutic drug level monitoring: Secondary | ICD-10-CM | POA: Diagnosis not present

## 2014-10-14 LAB — IRON AND TIBC
Iron: 27 ug/dL — ABNORMAL LOW (ref 42–165)
Saturation Ratios: 9 % — ABNORMAL LOW (ref 20–55)
TIBC: 315 ug/dL (ref 215–435)
UIBC: 288 ug/dL (ref 125–400)

## 2014-10-14 LAB — FERRITIN: Ferritin: 13 ng/mL — ABNORMAL LOW (ref 22–322)

## 2014-10-14 LAB — RENAL FUNCTION PANEL
ANION GAP: 13 (ref 5–15)
Albumin: 4 g/dL (ref 3.5–5.2)
BUN: 63 mg/dL — ABNORMAL HIGH (ref 6–23)
CO2: 22 mmol/L (ref 19–32)
Calcium: 9.7 mg/dL (ref 8.4–10.5)
Chloride: 108 mmol/L (ref 96–112)
Creatinine, Ser: 4.17 mg/dL — ABNORMAL HIGH (ref 0.50–1.35)
GFR calc Af Amer: 21 mL/min — ABNORMAL LOW (ref >90)
GFR calc non Af Amer: 18 mL/min — ABNORMAL LOW (ref >90)
Glucose, Bld: 91 mg/dL (ref 70–99)
PHOSPHORUS: 4.7 mg/dL — AB (ref 2.3–4.6)
Potassium: 4.5 mmol/L (ref 3.5–5.1)
Sodium: 143 mmol/L (ref 135–145)

## 2014-10-14 LAB — POCT HEMOGLOBIN-HEMACUE: Hemoglobin: 12.4 g/dL — ABNORMAL LOW (ref 13.0–17.0)

## 2014-10-14 MED ORDER — EPOETIN ALFA 10000 UNIT/ML IJ SOLN: 10000.0000 [IU] | INTRAMUSCULAR | Status: DC

## 2014-10-16 ENCOUNTER — Other Ambulatory Visit (HOSPITAL_COMMUNITY): Payer: Self-pay | Admitting: *Deleted

## 2014-10-19 ENCOUNTER — Encounter (HOSPITAL_COMMUNITY)
Admission: RE | Admit: 2014-10-19 | Discharge: 2014-10-19 | Disposition: A | Discharge status: Home / Self Care | Payer: Managed Care, Other (non HMO) | Source: Ambulatory Visit | Attending: Nephrology | Admitting: Nephrology

## 2014-10-19 DIAGNOSIS — D631 Anemia in chronic kidney disease: Secondary | ICD-10-CM | POA: Diagnosis not present

## 2014-10-19 MED ORDER — SODIUM CHLORIDE 0.9 % IV SOLN
510.0000 mg | INTRAVENOUS | Status: DC
  Administered 2014-10-19: 510 mg via INTRAVENOUS
  Filled 2014-10-19: qty 17

## 2014-10-19 NOTE — Discharge Instructions (Signed)

## 2014-10-26 ENCOUNTER — Encounter (HOSPITAL_COMMUNITY): Payer: Managed Care, Other (non HMO)

## 2014-10-29 ENCOUNTER — Encounter (HOSPITAL_COMMUNITY): Payer: Managed Care, Other (non HMO)

## 2014-10-29 ENCOUNTER — Encounter (HOSPITAL_COMMUNITY)
Admission: RE | Admit: 2014-10-29 | Discharge: 2014-10-29 | Disposition: A | Discharge status: Home / Self Care | Payer: Managed Care, Other (non HMO) | Source: Ambulatory Visit | Attending: Nephrology | Admitting: Nephrology

## 2014-10-29 DIAGNOSIS — D631 Anemia in chronic kidney disease: Secondary | ICD-10-CM | POA: Diagnosis not present

## 2014-10-29 LAB — MAGNESIUM: MAGNESIUM: 1.8 mg/dL (ref 1.5–2.5)

## 2014-10-29 LAB — POCT HEMOGLOBIN-HEMACUE: HEMOGLOBIN: 11.9 g/dL — AB (ref 13.0–17.0)

## 2014-10-29 MED ORDER — EPOETIN ALFA 10000 UNIT/ML IJ SOLN
10000.0000 [IU] | INTRAMUSCULAR | Status: DC
  Administered 2014-10-29: 10000 [IU] via SUBCUTANEOUS

## 2014-10-29 MED ORDER — SODIUM CHLORIDE 0.9 % IV SOLN
510.0000 mg | INTRAVENOUS | Status: DC
  Administered 2014-10-29: 510 mg via INTRAVENOUS
  Filled 2014-10-29: qty 17

## 2014-10-29 MED ORDER — EPOETIN ALFA 10000 UNIT/ML IJ SOLN
INTRAMUSCULAR | Status: AC
  Filled 2014-10-29: qty 1

## 2014-11-06 ENCOUNTER — Other Ambulatory Visit (HOSPITAL_COMMUNITY): Payer: Self-pay | Admitting: *Deleted

## 2014-11-09 ENCOUNTER — Encounter (HOSPITAL_COMMUNITY)
Admission: RE | Admit: 2014-11-09 | Discharge: 2014-11-09 | Disposition: A | Discharge status: Home / Self Care | Payer: Managed Care, Other (non HMO) | Source: Ambulatory Visit | Attending: Nephrology | Admitting: Nephrology

## 2014-11-09 DIAGNOSIS — D631 Anemia in chronic kidney disease: Secondary | ICD-10-CM | POA: Diagnosis present

## 2014-11-09 DIAGNOSIS — Z5181 Encounter for therapeutic drug level monitoring: Secondary | ICD-10-CM | POA: Diagnosis not present

## 2014-11-09 DIAGNOSIS — N184 Chronic kidney disease, stage 4 (severe): Secondary | ICD-10-CM | POA: Insufficient documentation

## 2014-11-09 LAB — RENAL FUNCTION PANEL
ALBUMIN: 3.8 g/dL (ref 3.5–5.0)
Anion gap: 11 (ref 5–15)
BUN: 61 mg/dL — AB (ref 6–20)
CO2: 19 mmol/L — AB (ref 22–32)
CREATININE: 3.91 mg/dL — AB (ref 0.61–1.24)
Calcium: 9.5 mg/dL (ref 8.9–10.3)
Chloride: 112 mmol/L — ABNORMAL HIGH (ref 101–111)
GFR calc Af Amer: 23 mL/min — ABNORMAL LOW (ref >60)
GFR, EST NON AFRICAN AMERICAN: 20 mL/min — AB (ref >60)
Glucose, Bld: 90 mg/dL (ref 70–99)
Phosphorus: 4 mg/dL (ref 2.5–4.6)
Potassium: 4.1 mmol/L (ref 3.5–5.1)
Sodium: 142 mmol/L (ref 135–145)

## 2014-11-09 LAB — IRON AND TIBC
Iron: 119 ug/dL (ref 45–182)
Saturation Ratios: 51 % — ABNORMAL HIGH (ref 17.9–39.5)
TIBC: 235 ug/dL — AB (ref 250–450)
UIBC: 116 ug/dL

## 2014-11-09 LAB — POCT HEMOGLOBIN-HEMACUE: Hemoglobin: 12.9 g/dL — ABNORMAL LOW (ref 13.0–17.0)

## 2014-11-09 LAB — FERRITIN: Ferritin: 434 ng/mL — ABNORMAL HIGH (ref 24–336)

## 2014-11-09 MED ORDER — EPOETIN ALFA 10000 UNIT/ML IJ SOLN: 10000.0000 [IU] | INTRAMUSCULAR | Status: DC

## 2014-11-23 ENCOUNTER — Other Ambulatory Visit (HOSPITAL_COMMUNITY): Payer: Self-pay | Admitting: *Deleted

## 2014-11-24 ENCOUNTER — Inpatient Hospital Stay (HOSPITAL_COMMUNITY): Admission: RE | Admit: 2014-11-24 | Payer: Managed Care, Other (non HMO) | Source: Ambulatory Visit

## 2014-12-10 ENCOUNTER — Encounter (HOSPITAL_COMMUNITY)
Admission: RE | Admit: 2014-12-10 | Discharge: 2014-12-10 | Disposition: A | Discharge status: Home / Self Care | Payer: Managed Care, Other (non HMO) | Source: Ambulatory Visit | Attending: Nephrology | Admitting: Nephrology

## 2014-12-10 DIAGNOSIS — Z5181 Encounter for therapeutic drug level monitoring: Secondary | ICD-10-CM | POA: Insufficient documentation

## 2014-12-10 DIAGNOSIS — N184 Chronic kidney disease, stage 4 (severe): Secondary | ICD-10-CM | POA: Insufficient documentation

## 2014-12-10 DIAGNOSIS — D631 Anemia in chronic kidney disease: Secondary | ICD-10-CM | POA: Insufficient documentation

## 2014-12-10 LAB — RENAL FUNCTION PANEL
ALBUMIN: 3.9 g/dL (ref 3.5–5.0)
ANION GAP: 12 (ref 5–15)
BUN: 61 mg/dL — AB (ref 6–20)
CALCIUM: 9.8 mg/dL (ref 8.9–10.3)
CHLORIDE: 110 mmol/L (ref 101–111)
CO2: 18 mmol/L — AB (ref 22–32)
Creatinine, Ser: 4.65 mg/dL — ABNORMAL HIGH (ref 0.61–1.24)
GFR calc non Af Amer: 16 mL/min — ABNORMAL LOW (ref >60)
GFR, EST AFRICAN AMERICAN: 18 mL/min — AB (ref >60)
Glucose, Bld: 131 mg/dL — ABNORMAL HIGH (ref 65–99)
PHOSPHORUS: 4 mg/dL (ref 2.5–4.6)
Potassium: 5 mmol/L (ref 3.5–5.1)
Sodium: 140 mmol/L (ref 135–145)

## 2014-12-10 LAB — POCT HEMOGLOBIN-HEMACUE: Hemoglobin: 12.3 g/dL — ABNORMAL LOW (ref 13.0–17.0)

## 2014-12-10 LAB — FERRITIN: FERRITIN: 355 ng/mL — AB (ref 24–336)

## 2014-12-10 LAB — IRON AND TIBC
Iron: 78 ug/dL (ref 45–182)
Saturation Ratios: 33 % (ref 17.9–39.5)
TIBC: 237 ug/dL — ABNORMAL LOW (ref 250–450)
UIBC: 159 ug/dL

## 2014-12-10 LAB — MAGNESIUM: Magnesium: 1.6 mg/dL — ABNORMAL LOW (ref 1.7–2.4)

## 2014-12-10 MED ORDER — EPOETIN ALFA 10000 UNIT/ML IJ SOLN: 10000.0000 [IU] | INTRAMUSCULAR | Status: DC

## 2014-12-11 LAB — PTH, INTACT AND CALCIUM
Calcium, Total (PTH): 9.8 mg/dL (ref 8.7–10.2)
PTH: 97 pg/mL — ABNORMAL HIGH (ref 15–65)

## 2014-12-11 LAB — TACROLIMUS LEVEL: Tacrolimus (FK506) - LabCorp: 7.8 ng/mL (ref 2.0–20.0)

## 2014-12-11 LAB — EPSTEIN BARR VRS(EBV DNA BY PCR)
EBV DNA QN BY PCR: NEGATIVE {copies}/mL
LOG10 EBV DNA QN PCR: UNDETERMINED {Log_copies}/mL

## 2014-12-14 LAB — BK VIRUS QUANT PCR, URINE: BK VIRUS DNA QN PCR: NOT DETECTED {copies}/mL

## 2014-12-15 LAB — VITAMIN D 1,25 DIHYDROXY
VITAMIN D3 1, 25 (OH): 30 pg/mL
Vitamin D 1, 25 (OH)2 Total: 30 pg/mL
Vitamin D2 1, 25 (OH)2: <10 pg/mL

## 2014-12-22 ENCOUNTER — Encounter (HOSPITAL_COMMUNITY)
Admission: RE | Admit: 2014-12-22 | Discharge: 2014-12-22 | Disposition: A | Discharge status: Home / Self Care | Payer: Managed Care, Other (non HMO) | Source: Ambulatory Visit | Attending: Nephrology | Admitting: Nephrology

## 2014-12-22 DIAGNOSIS — D631 Anemia in chronic kidney disease: Secondary | ICD-10-CM | POA: Diagnosis not present

## 2014-12-22 LAB — POCT HEMOGLOBIN-HEMACUE: Hemoglobin: 12.4 g/dL — ABNORMAL LOW (ref 13.0–17.0)

## 2014-12-22 MED ORDER — EPOETIN ALFA 10000 UNIT/ML IJ SOLN: 10000.0000 [IU] | INTRAMUSCULAR | Status: DC

## 2014-12-25 ENCOUNTER — Encounter (HOSPITAL_COMMUNITY): Payer: Managed Care, Other (non HMO)

## 2015-01-04 ENCOUNTER — Encounter (HOSPITAL_COMMUNITY)
Admission: RE | Admit: 2015-01-04 | Discharge: 2015-01-04 | Disposition: A | Discharge status: Home / Self Care | Payer: Managed Care, Other (non HMO) | Source: Ambulatory Visit | Attending: Nephrology | Admitting: Nephrology

## 2015-01-04 DIAGNOSIS — D631 Anemia in chronic kidney disease: Secondary | ICD-10-CM | POA: Diagnosis not present

## 2015-01-04 LAB — RENAL FUNCTION PANEL
ALBUMIN: 3.8 g/dL (ref 3.5–5.0)
Anion gap: 11 (ref 5–15)
BUN: 50 mg/dL — AB (ref 6–20)
CALCIUM: 9 mg/dL (ref 8.9–10.3)
CO2: 20 mmol/L — ABNORMAL LOW (ref 22–32)
Chloride: 109 mmol/L (ref 101–111)
Creatinine, Ser: 4.31 mg/dL — ABNORMAL HIGH (ref 0.61–1.24)
GFR calc Af Amer: 20 mL/min — ABNORMAL LOW (ref >60)
GFR, EST NON AFRICAN AMERICAN: 17 mL/min — AB (ref >60)
Glucose, Bld: 101 mg/dL — ABNORMAL HIGH (ref 65–99)
Phosphorus: 3.8 mg/dL (ref 2.5–4.6)
Potassium: 4.5 mmol/L (ref 3.5–5.1)
Sodium: 140 mmol/L (ref 135–145)

## 2015-01-04 LAB — IRON AND TIBC
Iron: 111 ug/dL (ref 45–182)
Saturation Ratios: 48 % — ABNORMAL HIGH (ref 17.9–39.5)
TIBC: 232 ug/dL — ABNORMAL LOW (ref 250–450)
UIBC: 121 ug/dL

## 2015-01-04 LAB — POCT HEMOGLOBIN-HEMACUE: HEMOGLOBIN: 11.2 g/dL — AB (ref 13.0–17.0)

## 2015-01-04 LAB — MAGNESIUM: MAGNESIUM: 1.5 mg/dL — AB (ref 1.7–2.4)

## 2015-01-04 LAB — FERRITIN: Ferritin: 270 ng/mL (ref 24–336)

## 2015-01-04 MED ORDER — EPOETIN ALFA 10000 UNIT/ML IJ SOLN
INTRAMUSCULAR | Status: AC
  Administered 2015-01-04: 10000 [IU] via SUBCUTANEOUS
  Filled 2015-01-04: qty 1

## 2015-01-04 MED ORDER — EPOETIN ALFA 10000 UNIT/ML IJ SOLN
10000.0000 [IU] | INTRAMUSCULAR | Status: DC
  Administered 2015-01-04: 10000 [IU] via SUBCUTANEOUS

## 2015-01-05 LAB — TACROLIMUS LEVEL: TACROLIMUS (FK506) - LABCORP: 18.5 ng/mL (ref 2.0–20.0)

## 2015-01-14 ENCOUNTER — Encounter (HOSPITAL_COMMUNITY): Payer: Managed Care, Other (non HMO)

## 2015-01-18 ENCOUNTER — Encounter (HOSPITAL_COMMUNITY): Payer: Managed Care, Other (non HMO)

## 2015-01-21 ENCOUNTER — Encounter (HOSPITAL_COMMUNITY)
Admission: RE | Admit: 2015-01-21 | Discharge: 2015-01-21 | Disposition: A | Discharge status: Home / Self Care | Payer: Managed Care, Other (non HMO) | Source: Ambulatory Visit | Attending: Nephrology | Admitting: Nephrology

## 2015-01-21 DIAGNOSIS — D631 Anemia in chronic kidney disease: Secondary | ICD-10-CM | POA: Insufficient documentation

## 2015-01-21 DIAGNOSIS — N184 Chronic kidney disease, stage 4 (severe): Secondary | ICD-10-CM | POA: Diagnosis not present

## 2015-01-21 DIAGNOSIS — Z5181 Encounter for therapeutic drug level monitoring: Secondary | ICD-10-CM | POA: Insufficient documentation

## 2015-01-21 LAB — POCT HEMOGLOBIN-HEMACUE: HEMOGLOBIN: 11.7 g/dL — AB (ref 13.0–17.0)

## 2015-01-21 MED ORDER — EPOETIN ALFA 10000 UNIT/ML IJ SOLN: 10000.0000 [IU] | INTRAMUSCULAR | Status: DC

## 2015-02-04 ENCOUNTER — Encounter (HOSPITAL_COMMUNITY): Payer: Managed Care, Other (non HMO)

## 2015-02-12 ENCOUNTER — Other Ambulatory Visit (HOSPITAL_COMMUNITY): Payer: Self-pay | Admitting: *Deleted

## 2015-02-15 ENCOUNTER — Encounter (HOSPITAL_COMMUNITY)
Admission: RE | Admit: 2015-02-15 | Discharge: 2015-02-15 | Disposition: A | Discharge status: Home / Self Care | Payer: Managed Care, Other (non HMO) | Source: Ambulatory Visit | Attending: Nephrology | Admitting: Nephrology

## 2015-02-15 DIAGNOSIS — D631 Anemia in chronic kidney disease: Secondary | ICD-10-CM | POA: Insufficient documentation

## 2015-02-15 DIAGNOSIS — N184 Chronic kidney disease, stage 4 (severe): Secondary | ICD-10-CM | POA: Diagnosis not present

## 2015-02-15 DIAGNOSIS — Z5181 Encounter for therapeutic drug level monitoring: Secondary | ICD-10-CM | POA: Insufficient documentation

## 2015-02-15 LAB — IRON AND TIBC
IRON: 118 ug/dL (ref 45–182)
Saturation Ratios: 49 % — ABNORMAL HIGH (ref 17.9–39.5)
TIBC: 242 ug/dL — ABNORMAL LOW (ref 250–450)
UIBC: 124 ug/dL

## 2015-02-15 LAB — URINALYSIS W MICROSCOPIC (NOT AT ARMC)
Bilirubin Urine: NEGATIVE
GLUCOSE, UA: NEGATIVE mg/dL
Hgb urine dipstick: NEGATIVE
Ketones, ur: NEGATIVE mg/dL
NITRITE: NEGATIVE
Protein, ur: 30 mg/dL — AB
SPECIFIC GRAVITY, URINE: 1.008 (ref 1.005–1.030)
UROBILINOGEN UA: 0.2 mg/dL (ref 0.0–1.0)
pH: 6.5 (ref 5.0–8.0)

## 2015-02-15 LAB — RENAL FUNCTION PANEL
ALBUMIN: 3.9 g/dL (ref 3.5–5.0)
Anion gap: 14 (ref 5–15)
BUN: 66 mg/dL — ABNORMAL HIGH (ref 6–20)
CO2: 18 mmol/L — AB (ref 22–32)
CREATININE: 4.57 mg/dL — AB (ref 0.61–1.24)
Calcium: 9.4 mg/dL (ref 8.9–10.3)
Chloride: 107 mmol/L (ref 101–111)
GFR calc Af Amer: 19 mL/min — ABNORMAL LOW (ref >60)
GFR calc non Af Amer: 16 mL/min — ABNORMAL LOW (ref >60)
Glucose, Bld: 113 mg/dL — ABNORMAL HIGH (ref 65–99)
PHOSPHORUS: 3.9 mg/dL (ref 2.5–4.6)
Potassium: 4.1 mmol/L (ref 3.5–5.1)
SODIUM: 139 mmol/L (ref 135–145)

## 2015-02-15 LAB — CBC
HCT: 32.3 % — ABNORMAL LOW (ref 39.0–52.0)
Hemoglobin: 10.9 g/dL — ABNORMAL LOW (ref 13.0–17.0)
MCH: 30.4 pg (ref 26.0–34.0)
MCHC: 33.7 g/dL (ref 30.0–36.0)
MCV: 90.2 fL (ref 78.0–100.0)
PLATELETS: 269 10*3/uL (ref 150–400)
RBC: 3.58 MIL/uL — ABNORMAL LOW (ref 4.22–5.81)
RDW: 15.3 % (ref 11.5–15.5)
WBC: 14 10*3/uL — ABNORMAL HIGH (ref 4.0–10.5)

## 2015-02-15 LAB — FERRITIN: FERRITIN: 383 ng/mL — AB (ref 24–336)

## 2015-02-15 LAB — MAGNESIUM: MAGNESIUM: 1.6 mg/dL — AB (ref 1.7–2.4)

## 2015-02-15 MED ORDER — EPOETIN ALFA 10000 UNIT/ML IJ SOLN
10000.0000 [IU] | INTRAMUSCULAR | Status: DC
  Administered 2015-02-15: 10000 [IU] via SUBCUTANEOUS

## 2015-02-15 MED ORDER — EPOETIN ALFA 10000 UNIT/ML IJ SOLN
INTRAMUSCULAR | Status: AC
  Administered 2015-02-15: 10000 [IU] via SUBCUTANEOUS
  Filled 2015-02-15: qty 1

## 2015-02-25 ENCOUNTER — Encounter (HOSPITAL_COMMUNITY)
Admission: RE | Admit: 2015-02-25 | Discharge: 2015-02-25 | Disposition: A | Discharge status: Home / Self Care | Payer: Managed Care, Other (non HMO) | Source: Ambulatory Visit | Attending: Nephrology | Admitting: Nephrology

## 2015-02-25 DIAGNOSIS — D631 Anemia in chronic kidney disease: Secondary | ICD-10-CM | POA: Diagnosis not present

## 2015-02-25 LAB — POCT HEMOGLOBIN-HEMACUE: HEMOGLOBIN: 11 g/dL — AB (ref 13.0–17.0)

## 2015-02-25 MED ORDER — EPOETIN ALFA 10000 UNIT/ML IJ SOLN
10000.0000 [IU] | INTRAMUSCULAR | Status: DC
  Administered 2015-02-25: 10000 [IU] via SUBCUTANEOUS

## 2015-02-25 MED ORDER — EPOETIN ALFA 10000 UNIT/ML IJ SOLN
INTRAMUSCULAR | Status: AC
  Filled 2015-02-25: qty 1

## 2023-05-01 DIAGNOSIS — M7582 Other shoulder lesions, left shoulder: Secondary | ICD-10-CM | POA: Diagnosis not present

## 2023-05-01 DIAGNOSIS — M542 Cervicalgia: Secondary | ICD-10-CM | POA: Diagnosis not present

## 2023-05-22 DIAGNOSIS — D849 Immunodeficiency, unspecified: Secondary | ICD-10-CM | POA: Diagnosis not present

## 2023-05-22 DIAGNOSIS — Z9350 Unspecified cystostomy status: Secondary | ICD-10-CM | POA: Diagnosis not present

## 2023-05-22 DIAGNOSIS — L408 Other psoriasis: Secondary | ICD-10-CM | POA: Diagnosis not present

## 2023-05-22 DIAGNOSIS — Z79899 Other long term (current) drug therapy: Secondary | ICD-10-CM | POA: Diagnosis not present

## 2023-05-22 DIAGNOSIS — G809 Cerebral palsy, unspecified: Secondary | ICD-10-CM | POA: Diagnosis not present

## 2023-05-22 DIAGNOSIS — N390 Urinary tract infection, site not specified: Secondary | ICD-10-CM | POA: Diagnosis not present

## 2023-05-22 DIAGNOSIS — Z880 Allergy status to penicillin: Secondary | ICD-10-CM | POA: Diagnosis not present

## 2023-05-22 DIAGNOSIS — Z881 Allergy status to other antibiotic agents status: Secondary | ICD-10-CM | POA: Diagnosis not present

## 2023-05-22 DIAGNOSIS — Z792 Long term (current) use of antibiotics: Secondary | ICD-10-CM | POA: Diagnosis not present

## 2023-05-22 DIAGNOSIS — Z940 Kidney transplant status: Secondary | ICD-10-CM | POA: Diagnosis not present

## 2023-05-22 DIAGNOSIS — N319 Neuromuscular dysfunction of bladder, unspecified: Secondary | ICD-10-CM | POA: Diagnosis not present

## 2023-05-22 DIAGNOSIS — Z23 Encounter for immunization: Secondary | ICD-10-CM | POA: Diagnosis not present

## 2023-07-26 DIAGNOSIS — N319 Neuromuscular dysfunction of bladder, unspecified: Secondary | ICD-10-CM | POA: Diagnosis not present

## 2023-07-26 DIAGNOSIS — Z881 Allergy status to other antibiotic agents status: Secondary | ICD-10-CM | POA: Diagnosis not present

## 2023-07-26 DIAGNOSIS — Z880 Allergy status to penicillin: Secondary | ICD-10-CM | POA: Diagnosis not present

## 2023-07-26 DIAGNOSIS — N2889 Other specified disorders of kidney and ureter: Secondary | ICD-10-CM | POA: Diagnosis not present

## 2023-07-26 DIAGNOSIS — Z87440 Personal history of urinary (tract) infections: Secondary | ICD-10-CM | POA: Diagnosis not present

## 2023-07-26 DIAGNOSIS — Z940 Kidney transplant status: Secondary | ICD-10-CM | POA: Diagnosis not present

## 2023-07-26 DIAGNOSIS — E785 Hyperlipidemia, unspecified: Secondary | ICD-10-CM | POA: Diagnosis not present

## 2023-07-26 DIAGNOSIS — Z905 Acquired absence of kidney: Secondary | ICD-10-CM | POA: Diagnosis not present

## 2023-07-26 DIAGNOSIS — G809 Cerebral palsy, unspecified: Secondary | ICD-10-CM | POA: Diagnosis not present

## 2023-07-26 DIAGNOSIS — K219 Gastro-esophageal reflux disease without esophagitis: Secondary | ICD-10-CM | POA: Diagnosis not present

## 2023-07-26 DIAGNOSIS — Z888 Allergy status to other drugs, medicaments and biological substances status: Secondary | ICD-10-CM | POA: Diagnosis not present

## 2023-07-26 DIAGNOSIS — N270 Small kidney, unilateral: Secondary | ICD-10-CM | POA: Diagnosis not present

## 2023-07-26 DIAGNOSIS — D849 Immunodeficiency, unspecified: Secondary | ICD-10-CM | POA: Diagnosis not present

## 2023-07-26 DIAGNOSIS — R339 Retention of urine, unspecified: Secondary | ICD-10-CM | POA: Diagnosis not present

## 2023-07-26 DIAGNOSIS — Z79899 Other long term (current) drug therapy: Secondary | ICD-10-CM | POA: Diagnosis not present

## 2023-07-26 DIAGNOSIS — I10 Essential (primary) hypertension: Secondary | ICD-10-CM | POA: Diagnosis not present

## 2023-09-20 DIAGNOSIS — Z4822 Encounter for aftercare following kidney transplant: Secondary | ICD-10-CM | POA: Diagnosis not present

## 2023-11-02 DIAGNOSIS — R21 Rash and other nonspecific skin eruption: Secondary | ICD-10-CM | POA: Diagnosis not present

## 2023-11-02 DIAGNOSIS — I10 Essential (primary) hypertension: Secondary | ICD-10-CM | POA: Diagnosis not present

## 2023-11-02 DIAGNOSIS — Z4822 Encounter for aftercare following kidney transplant: Secondary | ICD-10-CM | POA: Diagnosis not present

## 2023-11-02 DIAGNOSIS — R809 Proteinuria, unspecified: Secondary | ICD-10-CM | POA: Diagnosis not present

## 2023-11-02 DIAGNOSIS — Z87440 Personal history of urinary (tract) infections: Secondary | ICD-10-CM | POA: Diagnosis not present

## 2023-11-02 DIAGNOSIS — D849 Immunodeficiency, unspecified: Secondary | ICD-10-CM | POA: Diagnosis not present

## 2023-11-02 DIAGNOSIS — Z940 Kidney transplant status: Secondary | ICD-10-CM | POA: Diagnosis not present

## 2024-01-25 DIAGNOSIS — D2272 Melanocytic nevi of left lower limb, including hip: Secondary | ICD-10-CM | POA: Diagnosis not present

## 2024-01-25 DIAGNOSIS — L409 Psoriasis, unspecified: Secondary | ICD-10-CM | POA: Diagnosis not present

## 2024-01-25 DIAGNOSIS — D229 Melanocytic nevi, unspecified: Secondary | ICD-10-CM | POA: Diagnosis not present

## 2024-01-25 DIAGNOSIS — D485 Neoplasm of uncertain behavior of skin: Secondary | ICD-10-CM | POA: Diagnosis not present

## 2024-01-25 DIAGNOSIS — L219 Seborrheic dermatitis, unspecified: Secondary | ICD-10-CM | POA: Diagnosis not present

## 2024-03-07 DIAGNOSIS — Z940 Kidney transplant status: Secondary | ICD-10-CM | POA: Diagnosis not present

## 2024-03-07 DIAGNOSIS — Z87440 Personal history of urinary (tract) infections: Secondary | ICD-10-CM | POA: Diagnosis not present

## 2024-03-07 DIAGNOSIS — M109 Gout, unspecified: Secondary | ICD-10-CM | POA: Diagnosis not present

## 2024-03-07 DIAGNOSIS — D849 Immunodeficiency, unspecified: Secondary | ICD-10-CM | POA: Diagnosis not present

## 2024-03-07 DIAGNOSIS — R809 Proteinuria, unspecified: Secondary | ICD-10-CM | POA: Diagnosis not present

## 2024-03-07 DIAGNOSIS — I10 Essential (primary) hypertension: Secondary | ICD-10-CM | POA: Diagnosis not present
